# Patient Record
Sex: Female | Born: 1965
Health system: Southern US, Community
[De-identification: ages and names within clinical notes are randomized; demographics above are authoritative.]

## PROBLEM LIST (undated history)

## (undated) ENCOUNTER — Ambulatory Visit (HOSPITAL_COMMUNITY)

## (undated) DIAGNOSIS — E119 Type 2 diabetes mellitus without complications: Secondary | ICD-10-CM

## (undated) HISTORY — PX: INTRAUTERINE DEVICE (IUD) INSERTION: SHX5877

## (undated) HISTORY — DX: Type 2 diabetes mellitus without complications: E11.9

## (undated) HISTORY — PX: OTHER SURGICAL HISTORY: SHX169

## (undated) HISTORY — PX: BREAST SURGERY: SHX581

---

## 1997-06-28 ENCOUNTER — Ambulatory Visit (HOSPITAL_COMMUNITY): Admission: RE | Admit: 1997-06-28 | Discharge: 1997-06-28 | Payer: Self-pay | Admitting: Gynecology

## 1998-06-29 ENCOUNTER — Other Ambulatory Visit: Admission: RE | Admit: 1998-06-29 | Discharge: 1998-06-29 | Payer: Self-pay | Admitting: Gynecology

## 1998-09-24 ENCOUNTER — Other Ambulatory Visit: Admission: RE | Admit: 1998-09-24 | Discharge: 1998-09-24 | Payer: Self-pay | Admitting: Obstetrics and Gynecology

## 1999-03-26 ENCOUNTER — Other Ambulatory Visit: Admission: RE | Admit: 1999-03-26 | Discharge: 1999-03-26 | Payer: Self-pay | Admitting: Gynecology

## 1999-10-08 ENCOUNTER — Other Ambulatory Visit: Admission: RE | Admit: 1999-10-08 | Discharge: 1999-10-08 | Payer: Self-pay | Admitting: Gynecology

## 1999-10-09 ENCOUNTER — Encounter (INDEPENDENT_AMBULATORY_CARE_PROVIDER_SITE_OTHER): Payer: Self-pay

## 1999-10-09 ENCOUNTER — Other Ambulatory Visit: Admission: RE | Admit: 1999-10-09 | Discharge: 1999-10-09 | Payer: Self-pay | Admitting: Gynecology

## 2000-06-25 ENCOUNTER — Other Ambulatory Visit: Admission: RE | Admit: 2000-06-25 | Discharge: 2000-06-25 | Payer: Self-pay | Admitting: Gynecology

## 2001-12-09 ENCOUNTER — Other Ambulatory Visit: Admission: RE | Admit: 2001-12-09 | Discharge: 2001-12-09 | Payer: Self-pay | Admitting: Gynecology

## 2003-08-22 ENCOUNTER — Emergency Department (HOSPITAL_COMMUNITY): Admission: EM | Admit: 2003-08-22 | Discharge: 2003-08-22 | Payer: Self-pay | Admitting: Emergency Medicine

## 2006-12-24 ENCOUNTER — Ambulatory Visit (HOSPITAL_COMMUNITY): Admission: RE | Admit: 2006-12-24 | Discharge: 2006-12-24 | Payer: Self-pay | Admitting: Plastic Surgery

## 2007-01-26 ENCOUNTER — Ambulatory Visit (HOSPITAL_BASED_OUTPATIENT_CLINIC_OR_DEPARTMENT_OTHER): Admission: RE | Admit: 2007-01-26 | Discharge: 2007-01-27 | Payer: Self-pay | Admitting: Plastic Surgery

## 2007-01-26 ENCOUNTER — Encounter (INDEPENDENT_AMBULATORY_CARE_PROVIDER_SITE_OTHER): Payer: Self-pay | Admitting: Plastic Surgery

## 2008-05-03 ENCOUNTER — Emergency Department (HOSPITAL_COMMUNITY): Admission: EM | Admit: 2008-05-03 | Discharge: 2008-05-03 | Payer: Self-pay | Admitting: Emergency Medicine

## 2008-12-03 ENCOUNTER — Emergency Department (HOSPITAL_COMMUNITY): Admission: EM | Admit: 2008-12-03 | Discharge: 2008-12-03 | Payer: Self-pay | Admitting: Emergency Medicine

## 2010-06-02 ENCOUNTER — Encounter: Payer: Self-pay | Admitting: Plastic Surgery

## 2010-09-24 NOTE — Op Note (Signed)
Carly Santiago, PONG NO.:  [id number]   MEDICAL RECORD NO.:  [id number]          PATIENT TYPE:  AMB   LOCATION:  DSC                          FACILITY:  [facility]   PHYSICIAN:  Mary Contogiannis, M.D.DATE OF BIRTH:  1966/03/12   DATE OF PROCEDURE:  01/26/2007  DATE OF DISCHARGE:                               OPERATIVE REPORT   PREOPERATIVE DIAGNOSIS:  Bilateral macromastia.   POSTOPERATIVE DIAGNOSIS:  Bilateral macromastia.   PROCEDURE:  Bilateral reduction mammoplasty.   ATTENDING SURGEON:  Brantley Persons, M.D.   ANESTHESIA:  General endotracheal.   ANESTHESIOLOGIST:  Zenon Mayo, M.D.   ESTIMATED BLOOD LOSS:  300 mL.   FLUID REPLACEMENT:  3000 mL crystalloid.   URINE OUTPUT:  350 mL.   COMPLICATIONS:  None.   INDICATIONS FOR PROCEDURE:  The patient is a 45 year old African  American female who has bilateral macromastia that is clinically  symptomatic.  She presents to undergo bilateral reduction mammoplasties.   PROCEDURE:  The patient was marked in the preop holding area for the  future bilateral reduction mammoplasties in the pattern of Wise.  She  was then taken back to the OR and placed on the table in the supine  position.  After adequate general endotracheal anesthesia was obtained,  the patient's chest was scrubbed with Betadine and alcohol and draped in  a sterile fashion.  The base of the breasts were injected with 1%  lidocaine with epinephrine.  After adequate hemostasis anesthesia had  taken effect, the procedure was begun.   Both of the breast reductions were performed in the following similar  manner.  The nipple-areolar complexes were marked with the 45-mm nipple  marker.  This was then incised and the skin de-epithelialized around the  nipple-areolar complex down to the inframammary crease in inferior  pedicle pattern.  Next, the medial, superior and lateral skin flaps were  elevated down to the chest wall.  The excess  fat and glandular tissue  were removed from the inferior pedicle.  The nipple-areolar complex was  examined and found to be pink and viable.  The wound was irrigated with  saline irrigation.  Meticulous hemostasis was obtained with the Bovie  electrocautery.  The inferior pedicle was centralized with a 3-0 Prolene  suture.  A #10 JP flat fully fluted drain was placed into the wound.  The skin flaps were brought together at the inverted T junction with a 2-  0 Prolene suture.  The incisions were stapled for temporary closure.   The breasts were then compared and found to have good shape and  symmetry.  The staples were removed, and the incisions were closed from  the medial aspect of the JP drain to the medial aspect of the [facility]  incision using a few 3-0 Monocryl sutures in the dermal layer, and then  the dermal and cuticular layers were closed in a single layer using the  Quill 2-0 PDO barbed suture.  Lateral to the JP drain, the incision was  closed with a 3-0 Monocryl in the dermal layer followed by 3-0 Monocryl  running intracuticular stitch  on the skin.  The vertical limb of the  incision was closed in the dermal layer using 3-0 Monocryl suture.   The patient was then placed in the upright position.  The future  location of the nipple-areolar complexes was marked on both breast  mounds using the 45-mm nipple marker.  She was then placed back in the  recumbent position.   Both of the nipple-areolar complexes were then brought out onto the  breast mound in the following similar manner.  The skin was incised  around the future nipple-areolar complex full-thickness through the skin  and subcutaneous tissues.  The nipple-areolar complex was then examined  and found be pink and viable and brought out into this aperture and sewn  in place using 4-0 Monocryl in the dermal layer followed by 5-0 Monocryl  running intracuticular stitch on the skin.  This 5-0 Monocryl suture was  then brought  on down the vertical limb to close the vertical limb  cuticular layer.  The JP drains were sewn in place using 3-0 nylon  suture.  The incisions were dressed with Benzoin and Steri-Strips and  the nipples additionally with bacitracin ointment and Adaptic.  4 x 4s  were placed over the incisions, and the patient was placed into a light  postoperative support bra.  There are no complications.   The patient tolerated the procedure well.  The final needle and sponge  counts were reported to be correct at the end the case.  She was then  awakened from general anesthesia and taken to the recovery room in  stable condition.  The patient will remain overnight in the [facility] for  aggressive pain control along with ambulation and monitoring of the  nipples and breast flaps.  She will have the option of being discharged  home if she would like or stay overnight.  If she stays overnight, she  will then be discharged in the morning.           ______________________________  Brantley Persons, M.D.     MC/MEDQ  D:  01/26/2007  T:  01/26/2007  Job:  [id number]

## 2011-02-20 LAB — POCT HEMOGLOBIN-HEMACUE
Hemoglobin: 12.8
Operator id: 116011

## 2012-02-13 ENCOUNTER — Emergency Department (HOSPITAL_COMMUNITY)
Admission: EM | Admit: 2012-02-13 | Discharge: 2012-02-13 | Disposition: A | Discharge status: Home / Self Care | Payer: Self-pay | Attending: Emergency Medicine | Admitting: Emergency Medicine

## 2012-02-13 ENCOUNTER — Encounter (HOSPITAL_COMMUNITY): Payer: Self-pay | Admitting: Emergency Medicine

## 2012-02-13 DIAGNOSIS — M549 Dorsalgia, unspecified: Secondary | ICD-10-CM | POA: Insufficient documentation

## 2012-02-13 LAB — URINALYSIS, ROUTINE W REFLEX MICROSCOPIC
Leukocytes, UA: NEGATIVE
Nitrite: NEGATIVE
Protein, ur: NEGATIVE mg/dL
Urobilinogen, UA: 0.2 mg/dL (ref 0.0–1.0)

## 2012-02-13 LAB — PREGNANCY, URINE: Preg Test, Ur: NEGATIVE

## 2012-02-13 MED ORDER — METHOCARBAMOL 500 MG PO TABS: 500.0000 mg | ORAL_TABLET | Freq: Two times a day (BID) | ORAL | Status: DC | PRN

## 2012-02-13 MED ORDER — NAPROXEN 500 MG PO TABS: 500.0000 mg | ORAL_TABLET | Freq: Two times a day (BID) | ORAL | Status: DC

## 2012-02-13 MED ORDER — TRAMADOL HCL 50 MG PO TABS: 50.0000 mg | ORAL_TABLET | Freq: Four times a day (QID) | ORAL | Status: DC | PRN

## 2012-02-13 MED ORDER — NAPROXEN 250 MG PO TABS
500.0000 mg | ORAL_TABLET | Freq: Once | ORAL | Status: AC
  Administered 2012-02-13: 500 mg via ORAL
  Filled 2012-02-13 (×2): qty 1

## 2012-02-13 NOTE — ED Provider Notes (Signed)
History     CSN: [id number]  Arrival date & time 02/13/12  0120   First MD Initiated Contact with Patient 02/13/12 0136      Chief Complaint  Patient presents with  . Abdominal Pain  . Back Pain    (Consider location/radiation/quality/duration/timing/severity/associated sxs/prior treatment) HPI Comments: The pt is a pleasant 46 y/o female with no sig PMH and no frequent infections who presents with several days of bialteral flank and low back pain that radiates to the lower abd and is crampin in nature.  Worse with movement and changing positions such as getting to standing position or laying down but minimal pain when laying down.  Denies fevers, chills, nasuea, vomiting, diarrhea or dysuria.  No hematuria, no frequency.  No vag d/c or bleeding.  Sx are gradually worsening.  Denies red flags for pathological back pain.  Patient is a 46 y.o. female presenting with abdominal pain and back pain. The history is provided by the patient.  Abdominal Pain The primary symptoms of the illness include abdominal pain. The primary symptoms of the illness do not include fever, nausea, vomiting, dysuria or vaginal discharge.  Additional symptoms associated with the illness include back pain. Symptoms associated with the illness do not include chills.  Back Pain  Associated symptoms include abdominal pain. Pertinent negatives include no fever and no dysuria.    History reviewed. No pertinent past medical history.  History reviewed. No pertinent past surgical history.  History reviewed. No pertinent family history.  History  Substance Use Topics  . Smoking status: Never Smoker   . Smokeless tobacco: Not on file  . Alcohol Use: No    OB History    Grav Para Term Preterm Abortions TAB SAB Ect Mult Living                  Review of Systems  Constitutional: Negative for fever and chills.  Gastrointestinal: Positive for abdominal pain. Negative for nausea and vomiting.  Genitourinary:  Negative for dysuria, decreased urine volume, vaginal discharge, difficulty urinating and menstrual problem.  Musculoskeletal: Positive for back pain.  Skin: Negative for rash.    Allergies  Review of patient's allergies indicates no known allergies.  Home Medications   Current Outpatient Rx  Name Route Sig Dispense Refill  . CYANOCOBALAMIN 1000 MCG/ML IJ SOLN Intramuscular Inject 1,000 mcg into the muscle every 30 (thirty) days.    . LORCASERIN HCL 10 MG PO TABS Oral Take 10 mg by mouth 2 (two) times daily.    . TURMERIC CURCUMIN PO CAPS Oral Take 2 capsules by mouth 2 (two) times daily.    Marland Kitchen PHENTERMINE HCL 37.5 MG PO CAPS Oral Take 37.5 mg by mouth every morning.    Marland Kitchen PRESCRIPTION MEDICATION Oral Take 1 tablet by mouth daily. Didrex 50 mg    . METHOCARBAMOL 500 MG PO TABS Oral Take 1 tablet (500 mg total) by mouth 2 (two) times daily as needed. 20 tablet 0  . NAPROXEN 500 MG PO TABS Oral Take 1 tablet (500 mg total) by mouth 2 (two) times daily with a meal. 30 tablet 0  . TRAMADOL HCL 50 MG PO TABS Oral Take 1 tablet (50 mg total) by mouth every 6 (six) hours as needed for pain. 15 tablet 0    BP 112/71  Pulse 62  Temp 98.2 F (36.8 C) (Oral)  Resp 18  SpO2 98%  Physical Exam  Nursing note and vitals reviewed. Constitutional: She appears well-developed and well-nourished. No  distress.  HENT:  Head: Normocephalic and atraumatic.  Eyes: Conjunctivae normal are normal. No scleral icterus.  Cardiovascular: Normal rate, regular rhythm and normal heart sounds.   Pulmonary/Chest: Effort normal and breath sounds normal. No respiratory distress. She has no wheezes. She has no rales.  Abdominal: Soft. Bowel sounds are normal. She exhibits no distension. There is no tenderness.       Bilateral mild CVA ttp   Musculoskeletal: Normal range of motion. She exhibits no edema.       Bilateral lower back ttp, no midline ttp  Neurological:       nromal gait, normal use of legs and arms,  normal sensation of lower extremities.  Skin: Skin is warm and dry. No rash noted. No erythema.    ED Course  Procedures (including critical care time)  Labs Reviewed  URINALYSIS, ROUTINE W REFLEX MICROSCOPIC - Abnormal; Notable for the following:    APPearance CLOUDY (*)     All other components within normal limits  PREGNANCY, URINE   No results found.   1. Back pain       MDM  No abd ttp and normal VS - Urine appears cloudy and concentrated - consider UTI / pyelo in ddx, UA pending, naprosyn ordered at pt request, no fever.    UA negative for infx, VS normal, reproducible pain to palp of lumbar paraspinal muscles, naprosyn given, home with same meds and ultram, pt in agreement and requesting d/c at this time.        Vida Roller, MD 02/13/12 [phone]

## 2012-02-13 NOTE — ED Notes (Signed)
Patient reports lower back pain that radiates around her front abdomen for the last two days.  Patient reports recently drinking a lot of dark sodas.  Patient denies chest pain, nausea, vomiting, and diarrhea.

## 2013-04-03 ENCOUNTER — Encounter (HOSPITAL_COMMUNITY): Payer: Self-pay | Admitting: Emergency Medicine

## 2013-04-03 ENCOUNTER — Emergency Department (HOSPITAL_COMMUNITY)
Admission: EM | Admit: 2013-04-03 | Discharge: 2013-04-03 | Disposition: A | Discharge status: Home / Self Care | Payer: 59 | Source: Home / Self Care | Attending: Emergency Medicine | Admitting: Emergency Medicine

## 2013-04-03 ENCOUNTER — Emergency Department (INDEPENDENT_AMBULATORY_CARE_PROVIDER_SITE_OTHER): Payer: 59

## 2013-04-03 DIAGNOSIS — L259 Unspecified contact dermatitis, unspecified cause: Secondary | ICD-10-CM

## 2013-04-03 DIAGNOSIS — L309 Dermatitis, unspecified: Secondary | ICD-10-CM

## 2013-04-03 DIAGNOSIS — S92514A Nondisplaced fracture of proximal phalanx of right lesser toe(s), initial encounter for closed fracture: Secondary | ICD-10-CM

## 2013-04-03 DIAGNOSIS — S92919A Unspecified fracture of unspecified toe(s), initial encounter for closed fracture: Secondary | ICD-10-CM

## 2013-04-03 MED ORDER — KETOCONAZOLE 2 % EX CREA: 1.0000 "application " | TOPICAL_CREAM | Freq: Every day | CUTANEOUS | Status: DC

## 2013-04-03 NOTE — ED Notes (Signed)
Reports stubbing right 3rd toe on piece of furniture approx 1.5 wks ago; C/O toe being crooked, difficulty bending toe.  Has been elevating, icing, and using buddy tape without relief.  Also c/o left axillary pruritis and raised rash x 1 wk.  Has been applying abx oint.  Denies any changes in deoderant, soaps, lotions.

## 2013-04-03 NOTE — ED Provider Notes (Signed)
Chief Complaint:   Chief Complaint  Patient presents with  . Toe Injury  . 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 She's been elevating it and applying buddy tape, but this toe is still painful and seems to be somewhat crooked. It hurts to walk or to put pressure on it. It's slightly swollen.  She also has a two-week history of a mildly pruritic rash in her left axilla. She cannot think of anything that she's come in contact with. She denies any other skin rash.  Review of Systems:  Other than noted above, the patient denies any of the following symptoms: Systemic:  No fevers, chills, or sweats.  No fatigue or tiredness. Musculoskeletal:  No joint pain, arthritis, bursitis, swelling, or back pain.  Neurological:  No muscular weakness, paresthesias.  PMFSH:  Past medical history, family history, social history, meds, and allergies were reviewed.    Physical Exam:   Vital signs:  BP 128/87  Pulse 63  Temp(Src) 98.2 F (36.8 C) (Oral)  Resp 16  SpO2 98% Gen:  Alert and oriented times 3.  In no distress. Musculoskeletal:  Exam of the foot reveals the middle to the right foot was swollen and tender to touch over the proximal phalanx. There was slight angulation laterally, and there is pain with movement.  Otherwise, all joints had a full a ROM with no swelling, bruising or deformity.  No edema, pulses full. Extremities were warm and pink.  Capillary refill was brisk.  Skin:  Clear, warm and dry.  Exam of the left axilla reveals a hyperpigmented, velvety rash. The right axilla is normal. Neuro:  Alert and oriented times 3.  Muscle strength was normal.  Sensation was intact to light touch.   Radiology:  Dg Foot Complete Right  04/03/2013   CLINICAL DATA:  Right foot injury to the 3rd toe.  EXAM: RIGHT FOOT COMPLETE - 3+  VIEW  COMPARISON:  None.  FINDINGS: Oblique and minimally displaced fracture is identified of the 3rd proximal phalanx. No other injuries are seen. Soft tissues are unremarkable. No significant arthropathy or bony lesions are identified.  IMPRESSION: Minimally displaced fracture of the 3rd proximal phalanx.   Electronically Signed   By: Irish Lack M.D.   On: 04/03/2013 09:42   I reviewed the images independently and personally and concur with the radiologist's findings.  Course in Urgent Care Center:   The toe was buddy taped, and the patient was placed in a postoperative boot. She will need to wear this through the end of the year.  Assessment:  The primary encounter diagnosis was Closed nondisplaced fracture of proximal phalanx of lesser toe of right foot, initial encounter. A diagnosis of Dermatitis was also pertinent to this visit.  The rash in the axilla may be bacterial or fungal infection. Differential diagnosis includes acanthosis nigricans. I'm going to try treatment with Nizoral cream. If no improvement after 2 weeks I suggested she see a dermatologist.  Plan:   1.  Meds:  The following meds were prescribed:   Discharge Medication List as of 04/03/2013  9:54 AM    START taking these medications   Details  ketoconazole (NIZORAL) 2 % cream Apply 1 application topically daily., Starting 04/03/2013, Until Discontinued, Normal        2.  Patient  Education/Counseling:  The patient was given appropriate handouts, self care instructions, and instructed in symptomatic relief including rest and activity, elevation, application of ice and compression.   3.  Follow up:  The patient was told to follow up if no better in 3 to 4 days, if becoming worse in any way, and given some red flag symptoms such as worsening pain which would prompt immediate return.  Follow up here for the foot problem and with a dermatologist, Dr. Para Skeans, for the skin problem.       Reuben Likes,  MD 04/03/13 1040

## 2013-05-24 ENCOUNTER — Other Ambulatory Visit: Payer: Self-pay

## 2013-05-24 DIAGNOSIS — Z1231 Encounter for screening mammogram for malignant neoplasm of breast: Secondary | ICD-10-CM

## 2013-06-13 ENCOUNTER — Ambulatory Visit: Payer: 59

## 2013-09-02 ENCOUNTER — Ambulatory Visit: Payer: 59

## 2013-09-02 ENCOUNTER — Emergency Department (HOSPITAL_COMMUNITY)
Admission: EM | Admit: 2013-09-02 | Discharge: 2013-09-02 | Disposition: A | Discharge status: Home / Self Care | Payer: 59 | Source: Home / Self Care

## 2013-09-02 ENCOUNTER — Encounter (HOSPITAL_COMMUNITY): Payer: Self-pay | Admitting: Emergency Medicine

## 2013-09-02 DIAGNOSIS — M7660 Achilles tendinitis, unspecified leg: Secondary | ICD-10-CM

## 2013-09-02 DIAGNOSIS — M7662 Achilles tendinitis, left leg: Secondary | ICD-10-CM

## 2013-09-02 NOTE — Discharge Instructions (Signed)
Achilles Tendinitis °Achilles tendinitis is inflammation of the tough, cord-like band that attaches the lower muscles of your leg to your heel (Achilles tendon). It is usually caused by overusing the tendon and joint involved.  °CAUSES °Achilles tendinitis can happen because of: °· A sudden increase in exercise or activity (such as running). °· Doing the same exercises or activities (such as jumping) over and over. °· Not warming up calf muscles before exercising. °· Exercising in shoes that are worn out or not made for exercise. °· Having arthritis or a bone growth on the back of the heel bone. This can rub against the tendon and hurt the tendon. °SIGNS AND SYMPTOMS °The most common symptoms are: °· Pain in the back of the leg, just above the heel. The pain usually gets worse with exercise and better with rest. °· Stiffness or soreness in the back of the leg, especially in the morning. °· Swelling of the skin over the Achilles tendon. °· Trouble standing on tiptoe. °Sometimes, an Achilles tendon tears (ruptures). Symptoms of an Achilles tendon rupture can include: °· Sudden, severe pain in the back of the leg. °· Trouble putting weight on the foot or walking normally. °DIAGNOSIS °Achilles tendinitis will be diagnosed based on symptoms and a physical examination. An X-ray may be done to check if another condition is causing your symptoms. An MRI may be ordered if your health care provider suspects you may have completely torn your tendon, which is called an Achilles tendon rupture.  °TREATMENT  °Achilles tendinitis usually gets better over time. It can take weeks to months to heal completely. Treatment focuses on treating the symptoms and helping the injury heal. °HOME CARE INSTRUCTIONS  °· Rest your Achilles tendon and avoid activities that cause pain. °· Apply ice to the injured area: °· Put ice in a plastic bag. °· Place a towel between your skin and the bag. °· Leave the ice on for 20 minutes, 2 3 times a  day °· Try to avoid using the tendon (other than gentle range of motion) while the tendon is painful. Do not resume use until instructed by your health care provider. Then begin use gradually. Do not increase use to the point of pain. If pain does develop, decrease use and continue the above measures. Gradually increase activities that do not cause discomfort until you achieve normal use. °· Do exercises to make your calf muscles stronger and more flexible. Your health care provider or physical therapist can recommend exercises for you to do. °· Wrap your ankle with an elastic bandage or other wrap. This can help keep your tendon from moving too much. Your health care provider will show you how to wrap your ankle correctly. °· Only take over-the-counter or prescription medicines for pain, discomfort, or fever as directed by your health care provider. °SEEK MEDICAL CARE IF:  °· Your pain and swelling increase or pain is uncontrolled with medicines. °· You develop new, unexplained symptoms or your symptoms get worse. °· You are unable to move your toes or foot. °· You develop warmth and swelling in your foot. °· You have an unexplained temperature. °MAKE SURE YOU:  °· Understand these instructions. °· Will watch your condition. °· Will get help right away if you are not doing well or get worse. °Document Released: 02/05/2005 Document Revised: 02/16/2013 Document Reviewed: 12/08/2012 °ExitCare® Patient Information ©2014 [employer]. ° °

## 2013-09-02 NOTE — ED Provider Notes (Signed)
Medical screening examination/treatment/procedure(s) were performed by resident physician or non-physician practitioner and as supervising physician I was immediately available for consultation/collaboration.   Pauline Good MD.   Billy Fischer, MD 09/02/13 [phone]

## 2013-09-02 NOTE — ED Provider Notes (Signed)
CSN: [id number]     Arrival date & time 09/02/13  1101 History   First MD Initiated Contact with Patient 09/02/13 1125     Chief Complaint  Patient presents with  . 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 Over past few weeks having discomfort in the lower 1/3 of the achilles tendon. Worse with ambulation and when wearing low sided shoes. No hx of trauma. Not a runner.   History reviewed. No pertinent past medical history. History reviewed. No pertinent past surgical history. History reviewed. No pertinent family history. History  Substance Use Topics  . Smoking status: Never Smoker   . Smokeless tobacco: Not on file  . Alcohol Use: No   OB History   Grav Para Term Preterm Abortions TAB SAB Ect Mult Living                 Review of Systems  Constitutional: Positive for activity change. Negative for fever, chills and fatigue.  HENT: Negative.   Respiratory: Negative.   Cardiovascular: Negative.   Musculoskeletal:       As per HPI  Skin: Negative for color change, pallor and rash.  Neurological: Negative.     Allergies  Review of patient's allergies indicates no known allergies.  Home Medications   Prior to Admission medications   Medication Sig Start Date End Date Taking? Authorizing Provider  cyanocobalamin (,VITAMIN B-12,) 1000 MCG/ML injection Inject 1,000 mcg into the muscle every 30 (thirty) days.    Historical Provider, MD  ketoconazole (NIZORAL) 2 % cream Apply 1 application topically daily. 04/03/13   Harden Mo, MD  Lorcaserin HCl (BELVIQ) 10 MG TABS Take 10 mg by mouth 2 (two) times daily.    Historical Provider, MD  methocarbamol (ROBAXIN) 500 MG tablet Take 1 tablet (500 mg total) by mouth 2 (two) times daily as needed. 02/13/12   Johnna Acosta, MD  Misc Natural  Products (TURMERIC CURCUMIN) CAPS Take 2 capsules by mouth 2 (two) times daily.    Historical Provider, MD  naproxen (NAPROSYN) 500 MG tablet Take 1 tablet (500 mg total) by mouth 2 (two) times daily with a meal. 02/13/12   Johnna Acosta, MD  phentermine 37.5 MG capsule Take 37.5 mg by mouth every morning.    Historical Provider, MD  PRESCRIPTION MEDICATION Take 1 tablet by mouth daily. Didrex 50 mg    Historical Provider, MD  traMADol (ULTRAM) 50 MG tablet Take 1 tablet (50 mg total) by mouth every 6 (six) hours as needed for pain. 02/13/12   Johnna Acosta, MD   BP 107/57  Pulse 60  Temp(Src) 98.3 F (36.8 C) (Oral)  Resp 18  SpO2 98% Physical Exam  Nursing note and vitals reviewed. Constitutional: She is oriented to person, place, and time. She appears well-developed and well-nourished. No distress.  HENT:  Head: Normocephalic and atraumatic.  Eyes: EOM are normal.  Pulmonary/Chest: Effort normal. No respiratory distress.  Musculoskeletal:  Nl dorsi-plantar flexion. Tenderness to the left achilles tendon, lower third. Pain reproduced with plantar flexion against resistance. No palpable deformities, edema or discoloration.   Neurological: She is alert and oriented to person, place, and time. No cranial nerve deficit.  Skin: Skin is warm and dry.  Psychiatric: She has a normal mood and affect.    ED Course  Procedures (including critical care time) Labs Review  Labs Reviewed - No data to display  Imaging Review No results found.   MDM   1. Tendonitis, Achilles, left     No evidence for rupture ASO Ice Limit excessive ambulation with push off without support for 1-2 weeks.     Janne Napoleon, NP 09/02/13 1147

## 2013-09-02 NOTE — ED Notes (Signed)
Reports having pain in left achilles.  Pain is worse when wearing flat shoes.  States on feet a lot at work and works 16 hour shift sometimes.  No relief with heat, elevation, or otc pain meds.  Denies injury.

## 2014-05-02 ENCOUNTER — Emergency Department (HOSPITAL_COMMUNITY): Payer: 59

## 2014-05-02 ENCOUNTER — Encounter (HOSPITAL_COMMUNITY): Payer: Self-pay | Admitting: Emergency Medicine

## 2014-05-02 ENCOUNTER — Emergency Department (HOSPITAL_COMMUNITY)
Admission: EM | Admit: 2014-05-02 | Discharge: 2014-05-02 | Disposition: A | Discharge status: Home / Self Care | Payer: 59 | Attending: Emergency Medicine | Admitting: Emergency Medicine

## 2014-05-02 DIAGNOSIS — M79651 Pain in right thigh: Secondary | ICD-10-CM | POA: Insufficient documentation

## 2014-05-02 DIAGNOSIS — Z79899 Other long term (current) drug therapy: Secondary | ICD-10-CM | POA: Insufficient documentation

## 2014-05-02 DIAGNOSIS — M25551 Pain in right hip: Secondary | ICD-10-CM | POA: Diagnosis present

## 2014-05-02 MED ORDER — HYDROCODONE-ACETAMINOPHEN 5-325 MG PO TABS: 1.0000 | ORAL_TABLET | Freq: Four times a day (QID) | ORAL | Status: DC | PRN

## 2014-05-02 MED ORDER — KETOROLAC TROMETHAMINE 60 MG/2ML IM SOLN
60.0000 mg | Freq: Once | INTRAMUSCULAR | Status: AC
  Administered 2014-05-02: 60 mg via INTRAMUSCULAR
  Filled 2014-05-02: qty 2

## 2014-05-02 MED ORDER — DIAZEPAM 5 MG PO TABS
5.0000 mg | ORAL_TABLET | Freq: Once | ORAL | Status: AC
  Administered 2014-05-02: 5 mg via ORAL

## 2014-05-02 MED ORDER — IBUPROFEN 600 MG PO TABS: 600.0000 mg | ORAL_TABLET | Freq: Three times a day (TID) | ORAL | Status: DC | PRN

## 2014-05-02 MED ORDER — OXYCODONE-ACETAMINOPHEN 5-325 MG PO TABS
1.0000 | ORAL_TABLET | Freq: Once | ORAL | Status: AC
  Administered 2014-05-02: 1 via ORAL
  Filled 2014-05-02: qty 1

## 2014-05-02 NOTE — ED Notes (Signed)
PT ambulated with baseline gait; VSS; A&Ox3; no signs of distress; respirations even and unlabored; skin warm and dry; no questions upon discharge.  

## 2014-05-02 NOTE — ED Notes (Signed)
Patient transported to X-ray 

## 2014-05-02 NOTE — ED Notes (Signed)
Pt. reports right hip pain onset yesterday , denies injury or fall, no strenuous activity or urinary discomfort. Pain worse with movement , palpation and certain positions .

## 2014-05-02 NOTE — Discharge Instructions (Signed)

## 2014-05-03 NOTE — ED Provider Notes (Signed)
CSN: [id number]     Arrival date & time 05/02/14  0434 History   First MD Initiated Contact with Patient 05/02/14 0505     Chief Complaint  Patient presents with  . Hip Pain      HPI Patient reports right hip pain since yesterday.  She does report starting a new exercise routine to 3 days ago.  She states the pain is began the past 24 hours.  She feels it in her right lateral thigh and in her right hip.  She has mild pain with range of motion of her right hip.  No fevers or chills.  No skin changes or redness.  No recent injury or trauma.  She denies numbness or tingling in her right foot.  No weakness of her right leg.  Patient has never had pain like this before.  She tried over-the-counter pain medication without improvement in her symptoms.   History reviewed. No pertinent past medical history. History reviewed. No pertinent past surgical history. No family history on file. History  Substance Use Topics  . Smoking status: Never Smoker   . Smokeless tobacco: Not on file  . Alcohol Use: No   OB History    No data available     Review of Systems  All other systems reviewed and are negative.     Allergies  Review of patient's allergies indicates no known allergies.  Home Medications   Prior to Admission medications   Medication Sig Start Date End Date Taking? Authorizing Provider  cyanocobalamin (,VITAMIN B-12,) 1000 MCG/ML injection Inject 1,000 mcg into the muscle every 30 (thirty) days.   Yes Historical Provider, MD  metFORMIN (GLUCOPHAGE) 500 MG tablet Take 250 mg by mouth every evening.   Yes Historical Provider, MD  phentermine 37.5 MG capsule Take 37.5 mg by mouth every morning.   Yes Historical Provider, MD  PRESCRIPTION MEDICATION Take 1 tablet by mouth daily. Didrex 50 mg   Yes Historical Provider, MD  HYDROcodone-acetaminophen (NORCO/VICODIN) 5-325 MG per tablet Take 1 tablet by mouth every 6 (six) hours as needed for moderate pain. 05/02/14   Hoy Morn, MD   ibuprofen (ADVIL,MOTRIN) 600 MG tablet Take 1 tablet (600 mg total) by mouth every 8 (eight) hours as needed. 05/02/14   Hoy Morn, MD  ketoconazole (NIZORAL) 2 % cream Apply 1 application topically daily. Patient not taking: Reported on 05/02/2014 04/03/13   Harden Mo, MD  Lorcaserin HCl (BELVIQ) 10 MG TABS Take 10 mg by mouth 2 (two) times daily.    Historical Provider, MD  methocarbamol (ROBAXIN) 500 MG tablet Take 1 tablet (500 mg total) by mouth 2 (two) times daily as needed. Patient not taking: Reported on 05/02/2014 02/13/12   Johnna Acosta, MD  Misc Natural Products (TURMERIC CURCUMIN) CAPS Take 2 capsules by mouth 2 (two) times daily.    Historical Provider, MD  naproxen (NAPROSYN) 500 MG tablet Take 1 tablet (500 mg total) by mouth 2 (two) times daily with a meal. Patient not taking: Reported on 05/02/2014 02/13/12   Johnna Acosta, MD  traMADol (ULTRAM) 50 MG tablet Take 1 tablet (50 mg total) by mouth every 6 (six) hours as needed for pain. Patient not taking: Reported on 05/02/2014 02/13/12   Johnna Acosta, MD   BP 100/63 mmHg  Pulse 59  Temp(Src) 98.2 F (36.8 C) (Oral)  Resp 18  Ht 5\' 9"  (1.753 m)  Wt 250 lb (113.399 kg)  BMI 36.90 kg/m2  SpO2  97% Physical Exam  Constitutional: She is oriented to person, place, and time. She appears well-developed and well-nourished. No distress.  HENT:  Head: Normocephalic and atraumatic.  Eyes: EOM are normal.  Neck: Normal range of motion.  Pulmonary/Chest: Effort normal.  Abdominal: Soft.  Musculoskeletal:  Normal PT and DP pulse in right foot.  Normal strength in right lower extremity.  Able to arrange right knee and right hip.  Mild pain with range of motion of right hip.  No significant focal tenderness of the lateral aspect of the thigh.  No overlying skin changes.  Neurological: She is alert and oriented to person, place, and time.  Skin: Skin is warm and dry.  Psychiatric: She has a normal mood and affect. Judgment  normal.  Nursing note and vitals reviewed.   ED Course  Procedures (including critical care time) Labs Review Labs Reviewed - No data to display  Imaging Review Dg Hip Complete Right  05/02/2014   CLINICAL DATA:  Acute onset of right lateral hip pain. Initial encounter.  EXAM: RIGHT HIP - COMPLETE 2+ VIEW  COMPARISON:  None.  FINDINGS: There is no evidence of fracture or dislocation. Both femoral heads are seated normally within their respective acetabula. The proximal right femur appears intact. No significant degenerative change is appreciated. The sacroiliac joints are unremarkable in appearance.  The visualized bowel gas pattern is grossly unremarkable in appearance. Scattered phleboliths are noted within the pelvis. An intrauterine device is noted overlying the mid pelvis.  IMPRESSION: No evidence of fracture or dislocation.   Electronically Signed   By: Garald Balding M.D.   On: 05/02/2014 06:45  I personally reviewed the imaging tests through PACS system I reviewed available ER/hospitalization records through the EMR    EKG Interpretation None      MDM   Final diagnoses:  Right hip pain    Patient feels much better at time of discharge.  Suspect overuse injury from her recent new exercise regimen.  No signs of infection.  Normal pulses in right foot.  Full range of motion of right knee and right hip.  X-ray normal.    Hoy Morn, MD 05/03/14 [phone]

## 2014-06-02 ENCOUNTER — Ambulatory Visit: Payer: 59

## 2014-06-21 ENCOUNTER — Ambulatory Visit: Payer: 59

## 2014-06-29 ENCOUNTER — Ambulatory Visit: Payer: Self-pay | Admitting: Podiatry

## 2015-05-13 HISTORY — PX: IUD REMOVAL: SHX5392

## 2016-03-04 DIAGNOSIS — H524 Presbyopia: Secondary | ICD-10-CM | POA: Diagnosis not present

## 2016-03-10 ENCOUNTER — Encounter (HOSPITAL_COMMUNITY): Payer: Self-pay | Admitting: Emergency Medicine

## 2016-03-10 ENCOUNTER — Emergency Department (HOSPITAL_COMMUNITY)
Admission: EM | Admit: 2016-03-10 | Discharge: 2016-03-10 | Disposition: A | Discharge status: Home / Self Care | Payer: 59 | Attending: Physician Assistant | Admitting: Physician Assistant

## 2016-03-10 DIAGNOSIS — T7840XA Allergy, unspecified, initial encounter: Secondary | ICD-10-CM | POA: Insufficient documentation

## 2016-03-10 DIAGNOSIS — Z79899 Other long term (current) drug therapy: Secondary | ICD-10-CM | POA: Diagnosis not present

## 2016-03-10 DIAGNOSIS — Z7982 Long term (current) use of aspirin: Secondary | ICD-10-CM | POA: Diagnosis not present

## 2016-03-10 MED ORDER — DIPHENHYDRAMINE HCL 50 MG/ML IJ SOLN
50.0000 mg | Freq: Once | INTRAMUSCULAR | Status: AC
  Administered 2016-03-10: 50 mg via INTRAVENOUS
  Filled 2016-03-10: qty 1

## 2016-03-10 MED ORDER — METHYLPREDNISOLONE SODIUM SUCC 125 MG IJ SOLR
125.0000 mg | Freq: Once | INTRAMUSCULAR | Status: AC
  Administered 2016-03-10: 125 mg via INTRAVENOUS
  Filled 2016-03-10: qty 2

## 2016-03-10 MED ORDER — EPINEPHRINE 0.3 MG/0.3ML IJ SOAJ: 0.3000 mg | Freq: Once | INTRAMUSCULAR | 2 refills | Status: AC

## 2016-03-10 MED ORDER — DIPHENHYDRAMINE HCL 50 MG/ML IJ SOLN: 25.0000 mg | Freq: Once | INTRAMUSCULAR | Status: DC

## 2016-03-10 MED ORDER — FAMOTIDINE IN NACL 20-0.9 MG/50ML-% IV SOLN
20.0000 mg | Freq: Once | INTRAVENOUS | Status: AC
  Administered 2016-03-10: 20 mg via INTRAVENOUS
  Filled 2016-03-10: qty 50

## 2016-03-10 MED ORDER — DIPHENHYDRAMINE HCL 25 MG PO TABS: 50.0000 mg | ORAL_TABLET | Freq: Four times a day (QID) | ORAL | 0 refills | Status: DC

## 2016-03-10 NOTE — ED Notes (Signed)
Pt reports using a night time cream called "Ponds". She states she has not used it in years and 5 minutes after using is when the reaction started.

## 2016-03-10 NOTE — ED Notes (Signed)
Patient reports that she ate shrimp this afternoon and ate more than usual. Patient states she has not had a reaction to shrimp before.

## 2016-03-10 NOTE — ED Triage Notes (Signed)
Pt c/o wheals, erythema, and hot sensation to face, hands. Tachycardia. Wheals to face and left conjunctiva. Throat tightness, lip swelling. In control of oral secretions. No SOB, no voice changes.

## 2016-03-10 NOTE — ED Provider Notes (Signed)
[facility] DEPT Provider Note   CSN: [id number] Arrival date & time: 03/10/16  1850     History   Chief Complaint Chief Complaint  Patient presents with  . Allergic Reaction    HPI Carly Santiago is a 50 y.o. female.  HPI   Patient is a 50 year old female presenting with allergic reaction symptoms. Patient reports that she took a shower today when she got out she had swelling in her hand. Shehad several uticaria on her face. Patient has not had any new shower materials, towels, cream. Patient said initially she thought she was having shortness of breath but that may been anxiety. She does not have any on arrival to her room in the emergency department.  She also reports eating increased shrimp yesterday.  History reviewed. No pertinent past medical history.  There are no active problems to display for this patient.   History reviewed. No pertinent surgical history.  OB History    No data available       Home Medications    Prior to Admission medications   Medication Sig Start Date End Date Taking? Authorizing Provider  cyanocobalamin (,VITAMIN B-12,) 1000 MCG/ML injection Inject 1,000 mcg into the muscle every 30 (thirty) days.    Historical Provider, MD  HYDROcodone-acetaminophen (NORCO/VICODIN) 5-325 MG per tablet Take 1 tablet by mouth every 6 (six) hours as needed for moderate pain. 05/02/14   Jola Schmidt, MD  ibuprofen (ADVIL,MOTRIN) 600 MG tablet Take 1 tablet (600 mg total) by mouth every 8 (eight) hours as needed. 05/02/14   Jola Schmidt, MD  ketoconazole (NIZORAL) 2 % cream Apply 1 application topically daily. Patient not taking: Reported on 05/02/2014 04/03/13   Harden Mo, MD  Lorcaserin HCl (BELVIQ) 10 MG TABS Take 10 mg by mouth 2 (two) times daily.    Historical Provider, MD  metFORMIN (GLUCOPHAGE) 500 MG tablet Take 250 mg by mouth every evening.    Historical Provider, MD  methocarbamol (ROBAXIN) 500 MG tablet Take 1 tablet (500 mg total) by  mouth 2 (two) times daily as needed. Patient not taking: Reported on 05/02/2014 02/13/12   Noemi Chapel, MD  Misc Natural Products (TURMERIC CURCUMIN) CAPS Take 2 capsules by mouth 2 (two) times daily.    Historical Provider, MD  naproxen (NAPROSYN) 500 MG tablet Take 1 tablet (500 mg total) by mouth 2 (two) times daily with a meal. Patient not taking: Reported on 05/02/2014 02/13/12   Noemi Chapel, MD  phentermine 37.5 MG capsule Take 37.5 mg by mouth every morning.    Historical Provider, MD  PRESCRIPTION MEDICATION Take 1 tablet by mouth daily. Didrex 50 mg    Historical Provider, MD  traMADol (ULTRAM) 50 MG tablet Take 1 tablet (50 mg total) by mouth every 6 (six) hours as needed for pain. Patient not taking: Reported on 05/02/2014 02/13/12   Noemi Chapel, MD    Family History History reviewed. No pertinent family history.  Social History Social History  Substance Use Topics  . Smoking status: Never Smoker  . Smokeless tobacco: Not on file  . Alcohol use No     Allergies   Review of patient's allergies indicates no known allergies.   Review of Systems Review of Systems  Constitutional: Negative for fatigue and fever.  HENT: Negative for facial swelling, sore throat and trouble swallowing.   Eyes: Negative for itching.  Respiratory: Negative for cough, chest tightness and shortness of breath.   Cardiovascular: Negative for chest pain.  Gastrointestinal: Negative for  abdominal pain.  Genitourinary: Negative for dysuria.  Skin: Positive for color change and rash.  Neurological: Negative for dizziness.  All other systems reviewed and are negative.    Physical Exam Updated Vital Signs BP 151/91   Pulse 69   Temp 98.2 F (36.8 C) (Oral)   Resp 16   SpO2 98%   Physical Exam  Constitutional: She is oriented to person, place, and time. She appears well-developed and well-nourished.  HENT:  Head: Normocephalic and atraumatic.  Eyes: Right eye exhibits no discharge.    Cardiovascular: Normal rate, regular rhythm and normal heart sounds.   No murmur heard. Pulmonary/Chest: Effort normal and breath sounds normal. She has no wheezes. She has no rales.  Abdominal: Soft. She exhibits no distension. There is no tenderness.  Neurological: She is oriented to person, place, and time.  Skin: Skin is warm and dry. She is not diaphoretic.  Scattered U to carry on face. Palm aspect of  hands are red and swollen.  Psychiatric: She has a normal mood and affect.  Nursing note and vitals reviewed.    ED Treatments / Results  Labs (all labs ordered are listed, but only abnormal results are displayed) Labs Reviewed - No data to display  EKG  EKG Interpretation None       Radiology No results found.  Procedures Procedures (including critical care time)  Medications Ordered in ED Medications  famotidine (PEPCID) IVPB 20 mg premix (20 mg Intravenous New Bag/Given 03/10/16 1917)  methylPREDNISolone sodium succinate (SOLU-MEDROL) 125 mg/2 mL injection 125 mg (125 mg Intravenous Given 03/10/16 1913)  diphenhydrAMINE (BENADRYL) injection 50 mg (50 mg Intravenous Given 03/10/16 1911)     Initial Impression / Assessment and Plan / ED Course  I have reviewed the triage vital signs and the nursing notes.  Pertinent labs & imaging results that were available during my care of the patient were reviewed by me and considered in my medical decision making (see chart for details).  Clinical Course    PT is a very pleasant 50 year old female here with allergic reaction-like symptoms. Patient had no known allergen exposure. She does have scattered uticaria on face. However nothing involving her lungs or her mouth. No reaction seen elsewhere on skin. Patient does have swelling and erythema to the palmar aspect of palms.  9:43 PM Much improved, she now ednorses that her hand cream hasn't been used in 5 years and was on her hands and face- the likely culprit today.    .    Final Clinical Impressions(s) / ED Diagnoses   Final diagnoses:  None    New Prescriptions New Prescriptions   No medications on file     [place] Dann Julio Alm, MD 03/10/16 2345

## 2016-03-10 NOTE — Discharge Instructions (Signed)
You were seen today with allergic reaction. We think this is likely to hand cream you used. Please use Benadryl around the clock to help with symptoms and we gave you a prescription for an EpiPen just in case it was the shrimp that you ate earlier today.

## 2016-03-27 MED FILL — EPINEPHRINE 0.3 MG AUTO-INJ: 0.3 | 30 days supply | Qty: 2 | Fill #0

## 2016-10-30 ENCOUNTER — Encounter: Payer: Self-pay | Admitting: Podiatry

## 2016-10-30 ENCOUNTER — Ambulatory Visit (INDEPENDENT_AMBULATORY_CARE_PROVIDER_SITE_OTHER): Payer: 59 | Admitting: Podiatry

## 2016-10-30 ENCOUNTER — Ambulatory Visit (INDEPENDENT_AMBULATORY_CARE_PROVIDER_SITE_OTHER): Payer: 59

## 2016-10-30 VITALS — BP 136/84 | HR 79 | Resp 16

## 2016-10-30 DIAGNOSIS — M722 Plantar fascial fibromatosis: Secondary | ICD-10-CM

## 2016-10-30 DIAGNOSIS — M7662 Achilles tendinitis, left leg: Secondary | ICD-10-CM

## 2016-10-30 DIAGNOSIS — M7661 Achilles tendinitis, right leg: Secondary | ICD-10-CM

## 2016-10-30 MED ORDER — METHYLPREDNISOLONE 4 MG PO TBPK: ORAL_TABLET | ORAL | 0 refills | Status: DC

## 2016-10-30 MED ORDER — MELOXICAM 15 MG PO TABS: 15.0000 mg | ORAL_TABLET | Freq: Every day | ORAL | 3 refills | Status: DC

## 2016-10-30 NOTE — Patient Instructions (Signed)

## 2016-10-30 NOTE — Progress Notes (Signed)
   Subjective:    Patient ID: Carly Santiago, female    DOB: [birth year], 51 y.o.   MRN: [id number]  HPI: She presents today with a chief complaint of pain to the posterior heel bilateral right greater than left. She states has been aching for about 7 months mornings are particularly bad with tightness she states that the higher the heel where the better my foot feels. She's try stretching and rolling a ball and arch she states is really no better. She's tried ice no avail.    Review of Systems  All other systems reviewed and are negative.      Objective:   Physical Exam: Vital signs are stable she is alert and oriented 3. Pulses are palpable. Neurologic sensorium is intact. Deep tendon reflexes are intact muscle strength is normal bilateral. Orthopedic evaluation demonstrates all joints distal to the ankle in full range of motion of the crepitation. She has pain on palpation of the Achilles tendon at the insertion sign posterior aspect of the bilateral ankle. Regress taken today do demonstrate soft tissue increase in density of the Achilles tendon that is insertion site. With posterior proximal oriented spurring. Some soft tissue increase in density at the plantar fascia cranial insertion site and we tender on palpation.        Assessment & Plan:  Plantar fasciitis and Achilles tendinitis bilateral.  Plan: I injected the right tilt today with dexamethasone and local anesthetic placed her in a Cam Walker and start her on a Medrol Dosepak to be followed by meloxicam and I will follow-up with her in 1 month.

## 2016-11-26 DIAGNOSIS — R81 Glycosuria: Secondary | ICD-10-CM | POA: Diagnosis not present

## 2016-11-26 DIAGNOSIS — N762 Acute vulvitis: Secondary | ICD-10-CM | POA: Diagnosis not present

## 2016-12-04 ENCOUNTER — Ambulatory Visit: Payer: 59 | Admitting: Podiatry

## 2016-12-09 ENCOUNTER — Ambulatory Visit: Payer: 59

## 2016-12-09 DIAGNOSIS — E1165 Type 2 diabetes mellitus with hyperglycemia: Secondary | ICD-10-CM | POA: Diagnosis not present

## 2016-12-11 DIAGNOSIS — E1165 Type 2 diabetes mellitus with hyperglycemia: Secondary | ICD-10-CM | POA: Diagnosis not present

## 2016-12-16 ENCOUNTER — Ambulatory Visit: Payer: 59

## 2016-12-23 ENCOUNTER — Ambulatory Visit: Payer: 59

## 2017-01-13 ENCOUNTER — Encounter: Payer: 59 | Attending: Endocrinology | Admitting: *Deleted

## 2017-01-13 DIAGNOSIS — Z713 Dietary counseling and surveillance: Secondary | ICD-10-CM | POA: Diagnosis not present

## 2017-01-13 DIAGNOSIS — E119 Type 2 diabetes mellitus without complications: Secondary | ICD-10-CM | POA: Diagnosis not present

## 2017-01-14 NOTE — Progress Notes (Signed)
Patient was seen on 01/13/2017 for the first of a series of three diabetes self-management courses at [facility]  Patient Education Plan per assessed needs and concerns is to attend four course education program for Diabetes Self Management Education.  The following learning objectives were met by the patient during this class:  Describe diabetes  State some common risk factors for diabetes  Defines the role of glucose and insulin  Identifies type of diabetes and pathophysiology  Describe the relationship between diabetes and cardiovascular risk  State the members of the Healthcare Team  States the rationale for glucose monitoring  State when to test glucose  State their individual Target Range  State the importance of logging glucose readings  Describe how to interpret glucose readings  Identifies A1C target  Explain the correlation between A1c and eAG values  State symptoms and treatment of high blood glucose  State symptoms and treatment of low blood glucose  Explain proper technique for glucose testing  Identifies proper sharps disposal  Handouts given during class include:  Living Well with Diabetes book  Carb Counting and Meal Planning book  Meal Plan Card  Carbohydrate guide  Meal planning worksheet  Low Sodium Flavoring Tips  The diabetes portion plate  T0P to eAG Conversion Chart  Diabetes Medications  Diabetes Recommended Care Schedule  Support Group  Diabetes Success Plan  Core Class Satisfaction Survey   . The patient's Newest Vital Sign Health Literacy Assessment score was 57%. . The patient scored 25%on the Diabetes Knowledge pre-test. . Educational strategies utilized during class included repetition, teach-back, eliminating medical jargon, and being open to questions.   Follow-Up Plan:  Attend core 2

## 2017-01-20 ENCOUNTER — Ambulatory Visit: Payer: 59

## 2017-01-27 ENCOUNTER — Ambulatory Visit: Payer: 59

## 2017-02-05 ENCOUNTER — Ambulatory Visit: Payer: Self-pay | Admitting: *Deleted

## 2017-03-03 ENCOUNTER — Encounter: Payer: Self-pay | Admitting: Podiatry

## 2017-03-03 ENCOUNTER — Ambulatory Visit (INDEPENDENT_AMBULATORY_CARE_PROVIDER_SITE_OTHER): Payer: 59 | Admitting: Podiatry

## 2017-03-03 DIAGNOSIS — M7661 Achilles tendinitis, right leg: Secondary | ICD-10-CM

## 2017-03-03 DIAGNOSIS — M7662 Achilles tendinitis, left leg: Secondary | ICD-10-CM | POA: Diagnosis not present

## 2017-03-04 NOTE — Progress Notes (Signed)
She presents today for follow-up of her Achilles tendinitis bilaterally. She states that the right foot is really hurting the left was not doing too bad. She states that she has started exercising again and it started to hurt. She states that maybe she was overdoing it.  Objective: Vital signs are stable she is alert and oriented 3. Pulses are palpable. She is pain on palpation posterior aspect of her right Achilles. Achilles appears to be intact the area is mildly warm to touch.  Assessment: Achilles tendinitis.  Plan: Continue meloxicam injected the right posterior Achilles area making sure not to inject into the Achilles itself it was just superior to the insertion site. An injected into the subcutaneous tissue. Only 2 mg of dexamethasone and local anesthetic was utilized.

## 2017-03-31 DIAGNOSIS — E1165 Type 2 diabetes mellitus with hyperglycemia: Secondary | ICD-10-CM | POA: Diagnosis not present

## 2017-04-01 DIAGNOSIS — E1165 Type 2 diabetes mellitus with hyperglycemia: Secondary | ICD-10-CM | POA: Diagnosis not present

## 2017-04-09 ENCOUNTER — Ambulatory Visit: Payer: Self-pay | Admitting: *Deleted

## 2017-07-09 DIAGNOSIS — M2550 Pain in unspecified joint: Secondary | ICD-10-CM | POA: Diagnosis not present

## 2017-07-09 DIAGNOSIS — E119 Type 2 diabetes mellitus without complications: Secondary | ICD-10-CM | POA: Diagnosis not present

## 2017-07-09 DIAGNOSIS — Z794 Long term (current) use of insulin: Secondary | ICD-10-CM | POA: Diagnosis not present

## 2017-07-13 ENCOUNTER — Other Ambulatory Visit (HOSPITAL_COMMUNITY): Payer: Self-pay | Admitting: General Surgery

## 2017-07-21 ENCOUNTER — Ambulatory Visit: Payer: 59 | Admitting: Psychology

## 2017-07-21 DIAGNOSIS — F509 Eating disorder, unspecified: Secondary | ICD-10-CM | POA: Diagnosis not present

## 2017-07-23 ENCOUNTER — Ambulatory Visit: Payer: Self-pay | Admitting: Registered"

## 2017-07-28 ENCOUNTER — Ambulatory Visit (HOSPITAL_COMMUNITY): Payer: 59

## 2017-07-28 ENCOUNTER — Other Ambulatory Visit (HOSPITAL_COMMUNITY): Payer: 59

## 2017-07-28 ENCOUNTER — Ambulatory Visit (INDEPENDENT_AMBULATORY_CARE_PROVIDER_SITE_OTHER): Payer: 59 | Admitting: Psychology

## 2017-07-28 DIAGNOSIS — F509 Eating disorder, unspecified: Secondary | ICD-10-CM

## 2017-07-30 ENCOUNTER — Encounter: Payer: Self-pay | Admitting: Skilled Nursing Facility1

## 2017-07-30 ENCOUNTER — Encounter: Payer: 59 | Attending: General Surgery | Admitting: Skilled Nursing Facility1

## 2017-07-30 DIAGNOSIS — Z713 Dietary counseling and surveillance: Secondary | ICD-10-CM | POA: Insufficient documentation

## 2017-07-30 DIAGNOSIS — Z6837 Body mass index (BMI) 37.0-37.9, adult: Secondary | ICD-10-CM | POA: Insufficient documentation

## 2017-07-30 DIAGNOSIS — E119 Type 2 diabetes mellitus without complications: Secondary | ICD-10-CM

## 2017-07-30 NOTE — Progress Notes (Signed)
Pre-Op Assessment Visit:  Pre-Operative RYGB Surgery  Medical Nutrition Therapy:  Appt start time: 7:41  End time:  8:35  Patient was seen on 07/30/2017 for Pre-Operative Nutrition Assessment. Assessment and letter of approval faxed to [facility] Bariatric Surgery Program coordinator on 07/30/2017.   Pt states she just found out she has diabetes about 8 months ago. Pt states he checks her blood sugars 4 times a day: fasting: 197, 271,187and post prandial: 203, 193, 112, 170, 250. Pt admits to incorrectly administering her insulin as well as forgetting to take.  Pt states she is getting surgery because she was diagnosed with diabetes. Pt states she signed up for the gym.  Pt did not understand diabetes and had a lot of misconceptions. Pt states if she can control her diabetes she will not need surgery.   Start weight at [facility]: 245.7 BMI: 37.80   24 hr Dietary Recall: First Meal: toast and frosted mini wheats with sugar or bacon and coffee or [place] sausage and peppers and eggs---bacon Snack: sunflower seeds and pistacios  Second Meal: chicken stew and chicken pasta or [place] breat or chicken salad---skipped---tortilla with cheese and peponi Snack: cream cheese and strawberries  Third Meal: [place] and peppers and onion or salad---spaggetti or shrimp alfredo with peppers and onions Snack: Beverages: green tea with milk, cream, and sugar, flavorings in water, coffee, carbonated pink lemonade, sweet tea, soda  Encouraged to engage in 150 minutes of moderate physical activity including cardiovascular and weight baring weekly  Handouts given during visit include:  . Pre-Op Goals . Bariatric Surgery Protein Shakes During the appointment today the following Pre-Op Goals were reviewed with the patient: . Maintain or lose weight as instructed by your surgeon . Make healthy food choices . Begin to limit portion sizes . Limited concentrated sugars and fried foods . Keep fat/sugar in  the single digits per serving on             food labels . Practice CHEWING your food  (aim for 30 chews per bite or until applesauce consistency) . Practice not drinking 15 minutes before, during, and 30 minutes after each meal/snack . Avoid all carbonated beverages  . Avoid/limit caffeinated beverages  . Avoid all sugar-sweetened beverages . Consume 3 meals per day; eat every 3-5 hours . Make a list of non-food related activities . Aim for 64-100 ounces of FLUID daily  . Aim for at least 60-80 grams of PROTEIN daily . Look for a liquid protein source that contain ?15 g protein and ?5 g carbohydrate  (ex: shakes, drinks, shots)  -Follow diet recommendations listed below   Energy and Macronutrient Recomendations: Calories: 1500 Carbohydrate: 170 Protein: 112 Fat: 42  Demonstrated degree of understanding via:  Teach Back  Teaching Method Utilized:  Visual Auditory Hands on  Barriers to learning/adherence to lifestyle change: uneducated in her disease   Patient to call the [facility] to enroll in Pre-Op and Post-Op Nutrition Education when surgery date is scheduled.

## 2017-08-06 ENCOUNTER — Encounter: Payer: Self-pay | Admitting: Skilled Nursing Facility1

## 2017-08-06 ENCOUNTER — Encounter: Payer: 59 | Admitting: Skilled Nursing Facility1

## 2017-08-06 DIAGNOSIS — Z713 Dietary counseling and surveillance: Secondary | ICD-10-CM | POA: Diagnosis not present

## 2017-08-06 DIAGNOSIS — E119 Type 2 diabetes mellitus without complications: Secondary | ICD-10-CM

## 2017-08-06 DIAGNOSIS — Z6837 Body mass index (BMI) 37.0-37.9, adult: Secondary | ICD-10-CM | POA: Diagnosis not present

## 2017-08-06 NOTE — Patient Instructions (Signed)
-  Take your bedtime insulin at the same time every night  -Eat breakfast within 1-1..5 hours of waking  -Eat every 3-5 hours throughout the day

## 2017-08-06 NOTE — Progress Notes (Signed)
RYGB Assessment:  1st [facility] Appointment.  Pt states if she can control her diabetes she will not need surgery.  Pt arrives having lost about 2 pounds. Pt states she is realizing she needs to actually take her insulin because it lowers her blood sugars when she takes it. Pt states when she has high blood sugars she gest headaches. Pt states she was getting busy and not prioritizing herself. Pt states she realized she cooks with sugar and butter in everything. Pt states she does not like vegetables. Pt states she will start at the gym. Pt blood sugars still running over 200 but now that she is starting to take her long acting insulin correctly her sugars are 140-180 fasting. Pt states she is trying to drink more water.   Start weight at [facility]: 245.7 BMI: 37.80  MEDICATIONS: See List   DIETARY INTAKE:  24-hr recall:  B ( AM): oatmeal and bacon Snk ( AM):  L ( PM): beef stew Snk ( PM): seeds and nuts D ( PM): frozen pizza Snk ( PM):  Beverages: juice  Usual physical activity: ADL's  Diet to Follow: 1500 calories 170 g carbohydrates 112 g protein 42 g fat   Nutritional Diagnosis:  [place]-3.3 Overweight/obesity related to past poor dietary habits and physical inactivity as evidenced by patient w/ planned RYGB surgery following dietary guidelines for continued weight loss.    Intervention:  Nutrition counseling for upcoming Bariatric Surgery. Goals: -Encouraged to engage in 150 minutes of moderate physical activity including cardiovascular and weight baring weekly -Take your bedtime insulin at the same time every night -Eat breakfast within 1-1..5 hours of waking -Eat every 3-5 hours throughout the day Teaching Method Utilized:  Visual Auditory Hands on  Handouts given during visit include:  Yellow card  Barriers to learning/adherence to lifestyle change: none identified   Demonstrated degree of understanding via:  Teach Back   Monitoring/Evaluation:  Dietary intake, exercise,and  body weight prn.

## 2017-08-13 ENCOUNTER — Ambulatory Visit (HOSPITAL_COMMUNITY)
Admission: RE | Admit: 2017-08-13 | Discharge: 2017-08-13 | Disposition: A | Discharge status: Home / Self Care | Payer: 59 | Source: Ambulatory Visit | Attending: General Surgery | Admitting: General Surgery

## 2017-08-13 ENCOUNTER — Encounter (HOSPITAL_COMMUNITY): Payer: Self-pay | Admitting: Radiology

## 2017-08-13 ENCOUNTER — Other Ambulatory Visit: Payer: Self-pay

## 2017-08-13 DIAGNOSIS — Z01810 Encounter for preprocedural cardiovascular examination: Secondary | ICD-10-CM | POA: Diagnosis not present

## 2017-08-13 DIAGNOSIS — R001 Bradycardia, unspecified: Secondary | ICD-10-CM | POA: Insufficient documentation

## 2017-08-13 DIAGNOSIS — Z01818 Encounter for other preprocedural examination: Secondary | ICD-10-CM | POA: Diagnosis not present

## 2017-08-13 DIAGNOSIS — K228 Other specified diseases of esophagus: Secondary | ICD-10-CM | POA: Insufficient documentation

## 2017-08-27 ENCOUNTER — Encounter (INDEPENDENT_AMBULATORY_CARE_PROVIDER_SITE_OTHER): Payer: 59

## 2017-09-02 ENCOUNTER — Encounter (INDEPENDENT_AMBULATORY_CARE_PROVIDER_SITE_OTHER): Payer: Self-pay

## 2017-09-02 ENCOUNTER — Ambulatory Visit (INDEPENDENT_AMBULATORY_CARE_PROVIDER_SITE_OTHER): Payer: 59 | Admitting: Family Medicine

## 2017-09-07 NOTE — Patient Instructions (Addendum)
Carly Santiago  09/07/2017   Your procedure is scheduled on: 09-21-17 Monday  Report to [facility] Main  Entrance   Report to admitting at [place]   Call this number if you have problems the morning of surgery [phone]    Remember: Do not eat food or drink liquids :After Midnight.    How to Manage Your Diabetes Before and After Surgery  Why is it important to control my blood sugar before and after surgery? . Improving blood sugar levels before and after surgery helps healing and can limit problems. . A way of improving blood sugar control is eating a healthy diet by: o  Eating less sugar and carbohydrates o  Increasing activity/exercise o  Talking with your doctor about reaching your blood sugar goals . High blood sugars (greater than 180 mg/dL) can raise your risk of infections and slow your recovery, so you will need to focus on controlling your diabetes during the weeks before surgery. . Make sure that the doctor who takes care of your diabetes knows about your planned surgery including the date and location.  How do I manage my blood sugar before surgery? . Check your blood sugar at least 4 times a day, starting 2 days before surgery, to make sure that the level is not too high or low. o Check your blood sugar the morning of your surgery when you wake up and every 2 hours until you get to the Short Stay unit. . If your blood sugar is less than 70 mg/dL, you will need to treat for low blood sugar: o Do not take insulin. o Treat a low blood sugar (less than 70 mg/dL) with  cup of clear juice (cranberry or apple), 4 glucose tablets, OR glucose gel. o Recheck blood sugar in 15 minutes after treatment (to make sure it is greater than 70 mg/dL). If your blood sugar is not greater than 70 mg/dL on recheck, call [phone] for further instructions. . Report your blood sugar to the short stay nurse when you get to Short  Stay.  . If you are admitted to the hospital after surgery: o Your blood sugar will be checked by the staff and you will probably be given insulin after surgery (instead of oral diabetes medicines) to make sure you have good blood sugar levels. o The goal for blood sugar control after surgery is 80-180 mg/dL.   WHAT DO I DO ABOUT MY DIABETES MEDICATION?      May take 1/2 dose of Fiasp insulin the night before at supper if needed.!  . THE NIGHT BEFORE SURGERY, take  22   units of    Tresiba   insulin.       . THE MORNING OF SURGERY, take    0 units of  Fiasp       insulin.     DO NOT TAKE ANY DIABETIC MEDICATIONS THE MORNING OF YOUR SURGERY!              ________________________________________________________________________    Take these medicines the morning of surgery with A SIP OF WATER: NONE                                You may not have any metal on your body including hair pins and  piercings  Do not wear jewelry, make-up, lotions, powders or perfumes, deodorant             Do not wear nail polish.  Do not shave  48 hours prior to surgery.                Do not bring valuables to the hospital. [place].  Contacts, dentures or bridgework may not be worn into surgery.  Leave suitcase in the car. After surgery it may be brought to your room.                Please read over the following fact sheets you were given: _____________________________________________________________________  [facility] - Preparing for Surgery Before surgery, you can play an important role.  Because skin is not sterile, your skin needs to be as free of germs as possible.  You can reduce the number of germs on your skin by washing with CHG (chlorahexidine gluconate) soap before surgery.  CHG is an antiseptic cleaner which kills germs and bonds with the skin to continue killing germs even after washing. Please DO NOT use if you have  an allergy to CHG or antibacterial soaps.  If your skin becomes reddened/irritated stop using the CHG and inform your nurse when you arrive at Short Stay. Do not shave (including legs and underarms) for at least 48 hours prior to the first CHG shower.  You may shave your face/neck. Please follow these instructions carefully:  1.  Shower with CHG Soap the night before surgery and the  morning of Surgery.  2.  If you choose to wash your hair, wash your hair first as usual with your  normal  shampoo.  3.  After you shampoo, rinse your hair and body thoroughly to remove the  shampoo.                           4.  Use CHG as you would any other liquid soap.  You can apply chg directly  to the skin and wash                       Gently with a scrungie or clean washcloth.  5.  Apply the CHG Soap to your body ONLY FROM THE NECK DOWN.   Do not use on face/ open                           Wound or open sores. Avoid contact with eyes, ears mouth and genitals (private parts).                       Wash face,  Genitals (private parts) with your normal soap.             6.  Wash thoroughly, paying special attention to the area where your surgery  will be performed.  7.  Thoroughly rinse your body with warm water from the neck down.  8.  DO NOT shower/wash with your normal soap after using and rinsing off  the CHG Soap.                9.  Pat yourself dry with a clean towel.            10.  Wear  clean pajamas.            11.  Place clean sheets on your bed the night of your first shower and do not  sleep with pets. Day of Surgery : Do not apply any lotions/deodorants the morning of surgery.  Please wear clean clothes to the hospital/surgery center.  FAILURE TO FOLLOW THESE INSTRUCTIONS MAY RESULT IN THE CANCELLATION OF YOUR SURGERY PATIENT SIGNATURE_________________________________  NURSE  SIGNATURE__________________________________  ________________________________________________________________________              Adam Phenix  An incentive spirometer is a tool that can help keep your lungs clear and active. This tool measures how well you are filling your lungs with each breath. Taking long deep breaths may help reverse or decrease the chance of developing breathing (pulmonary) problems (especially infection) following:  A long period of time when you are unable to move or be active. BEFORE THE PROCEDURE   If the spirometer includes an indicator to show your best effort, your nurse or respiratory therapist will set it to a desired goal.  If possible, sit up straight or lean slightly forward. Try not to slouch.  Hold the incentive spirometer in an upright position. INSTRUCTIONS FOR USE  1. Sit on the edge of your bed if possible, or sit up as far as you can in bed or on a chair. 2. Hold the incentive spirometer in an upright position. 3. Breathe out normally. 4. Place the mouthpiece in your mouth and seal your lips tightly around it. 5. Breathe in slowly and as deeply as possible, raising the piston or the ball toward the top of the column. 6. Hold your breath for 3-5 seconds or for as long as possible. Allow the piston or ball to fall to the bottom of the column. 7. Remove the mouthpiece from your mouth and breathe out normally. 8. Rest for a few seconds and repeat Steps 1 through 7 at least 10 times every 1-2 hours when you are awake. Take your time and take a few normal breaths between deep breaths. 9. The spirometer may include an indicator to show your best effort. Use the indicator as a goal to work toward during each repetition. 10. After each set of 10 deep breaths, practice coughing to be sure your lungs are clear. If you have an incision (the cut made at the time of surgery), support your incision when coughing by placing a pillow or rolled up towels  firmly against it. Once you are able to get out of bed, walk around indoors and cough well. You may stop using the incentive spirometer when instructed by your caregiver.  RISKS AND COMPLICATIONS  Take your time so you do not get dizzy or light-headed.  If you are in pain, you may need to take or ask for pain medication before doing incentive spirometry. It is harder to take a deep breath if you are having pain. AFTER USE  Rest and breathe slowly and easily.  It can be helpful to keep track of a log of your progress. Your caregiver can provide you with a simple table to help with this. If you are using the spirometer at home, follow these instructions: [place] IF:   You are having difficultly using the spirometer.  You have trouble using the spirometer as often as instructed.  Your pain medication is not giving enough relief while using the spirometer.  You develop fever of 100.5 F (38.1 C) or higher. [place]  CARE IF:   You cough up bloody sputum that had not been present before.  You develop fever of 102 F (38.9 C) or greater.  You develop worsening pain at or near the incision site. MAKE SURE YOU:   Understand these instructions.  Will watch your condition.  Will get help right away if you are not doing well or get worse. Document Released: 09/08/2006 Document Revised: 07/21/2011 Document Reviewed: 11/09/2006 [facility] Patient Information 2014 [place], [place].   ________________________________________________________________________

## 2017-09-07 NOTE — Progress Notes (Signed)
CHEST XRAY 08-13-17 Epic EKG 08-13-17 EPIC

## 2017-09-08 ENCOUNTER — Encounter (HOSPITAL_COMMUNITY): Payer: Self-pay

## 2017-09-08 ENCOUNTER — Encounter (HOSPITAL_COMMUNITY)
Admission: RE | Admit: 2017-09-08 | Discharge: 2017-09-08 | Disposition: A | Discharge status: Home / Self Care | Payer: 59 | Source: Ambulatory Visit | Attending: General Surgery | Admitting: General Surgery

## 2017-09-08 ENCOUNTER — Other Ambulatory Visit: Payer: Self-pay

## 2017-09-08 DIAGNOSIS — Z01812 Encounter for preprocedural laboratory examination: Secondary | ICD-10-CM | POA: Insufficient documentation

## 2017-09-08 LAB — BASIC METABOLIC PANEL
Anion gap: 9 (ref 5–15)
BUN: 7 mg/dL (ref 6–20)
CHLORIDE: 104 mmol/L (ref 101–111)
CO2: 26 mmol/L (ref 22–32)
CREATININE: 0.75 mg/dL (ref 0.44–1.00)
Calcium: 9.2 mg/dL (ref 8.9–10.3)
Glucose, Bld: 194 mg/dL — ABNORMAL HIGH (ref 65–99)
Potassium: 4 mmol/L (ref 3.5–5.1)
SODIUM: 139 mmol/L (ref 135–145)

## 2017-09-08 LAB — GLUCOSE, CAPILLARY: Glucose-Capillary: 201 mg/dL — ABNORMAL HIGH (ref 65–99)

## 2017-09-08 LAB — CBC
HCT: 37.8 % (ref 36.0–46.0)
HEMOGLOBIN: 12.4 g/dL (ref 12.0–15.0)
MCH: 29.4 pg (ref 26.0–34.0)
MCHC: 32.8 g/dL (ref 30.0–36.0)
MCV: 89.6 fL (ref 78.0–100.0)
PLATELETS: 336 10*3/uL (ref 150–400)
RBC: 4.22 MIL/uL (ref 3.87–5.11)
RDW: 13.6 % (ref 11.5–15.5)
WBC: 5.3 10*3/uL (ref 4.0–10.5)

## 2017-09-08 LAB — HCG, SERUM, QUALITATIVE: PREG SERUM: NEGATIVE

## 2017-09-08 LAB — HEMOGLOBIN A1C
HEMOGLOBIN A1C: 7.4 % — AB (ref 4.8–5.6)
MEAN PLASMA GLUCOSE: 165.68 mg/dL

## 2017-09-10 ENCOUNTER — Ambulatory Visit: Payer: Self-pay | Admitting: General Surgery

## 2017-09-10 MED FILL — oxyCODONE HCL 5 MG/5ML SOLN: 5 | 2 days supply | Qty: 120 | Fill #0

## 2017-09-16 ENCOUNTER — Telehealth (HOSPITAL_COMMUNITY): Payer: Self-pay

## 2017-09-16 ENCOUNTER — Telehealth: Payer: Self-pay

## 2017-09-16 NOTE — Telephone Encounter (Signed)
Patient provided pre op education via telephone.  Electronic educational material emailed to patient for review.  She has attended her pre op appointment at [facility] for all testing required before surgery.  Contact information provided to patient for any additional questions.  Patient has no questions at this time.

## 2017-09-20 NOTE — Anesthesia Preprocedure Evaluation (Addendum)
Anesthesia Evaluation  Patient identified by MRN, date of birth, ID band Patient awake    Reviewed: Allergy & Precautions, NPO status , Patient's Chart, lab work & pertinent test results  History of Anesthesia Complications Negative for: history of anesthetic complications  Airway Mallampati: I  TM Distance: >3 FB Neck ROM: Full    Dental  (+) Dental Advisory Given, Missing   Pulmonary neg pulmonary ROS,    breath sounds clear to auscultation       Cardiovascular negative cardio ROS   Rhythm:Regular Rate:Normal     Neuro/Psych negative neurological ROS     GI/Hepatic negative GI ROS, Neg liver ROS,   Endo/Other  diabetes (glu 170), Insulin DependentMorbid obesity  Renal/GU negative Renal ROS     Musculoskeletal   Abdominal (+) + obese,   Peds  Hematology negative hematology ROS (+)   Anesthesia Other Findings   Reproductive/Obstetrics                            Anesthesia Physical Anesthesia Plan  ASA: III  Anesthesia Plan: General   Post-op Pain Management:    Induction: Intravenous  PONV Risk Score and Plan: 4 or greater and Ondansetron, Dexamethasone and Scopolamine patch - Pre-op  Airway Management Planned: Oral ETT  Additional Equipment:   Intra-op Plan:   Post-operative Plan: Extubation in OR  Informed Consent: I have reviewed the patients History and Physical, chart, labs and discussed the procedure including the risks, benefits and alternatives for the proposed anesthesia with the patient or authorized representative who has indicated his/her understanding and acceptance.   Dental advisory given  Plan Discussed with: CRNA and Surgeon  Anesthesia Plan Comments: (Plan routine monitors, GETA)        Anesthesia Quick Evaluation

## 2017-09-21 ENCOUNTER — Encounter (HOSPITAL_COMMUNITY): Payer: Self-pay | Admitting: *Deleted

## 2017-09-21 ENCOUNTER — Inpatient Hospital Stay (HOSPITAL_COMMUNITY): Payer: 59 | Admitting: Anesthesiology

## 2017-09-21 ENCOUNTER — Inpatient Hospital Stay (HOSPITAL_COMMUNITY)
Admission: RE | Admit: 2017-09-21 | Discharge: 2017-09-23 | DRG: 621 | Disposition: A | Discharge status: Home / Self Care | Payer: 59 | Source: Ambulatory Visit | Attending: General Surgery | Admitting: General Surgery

## 2017-09-21 ENCOUNTER — Other Ambulatory Visit: Payer: Self-pay

## 2017-09-21 ENCOUNTER — Encounter (HOSPITAL_COMMUNITY)
Admission: RE | Disposition: A | Discharge status: Home / Self Care | Payer: Self-pay | Source: Ambulatory Visit | Attending: General Surgery

## 2017-09-21 DIAGNOSIS — Z8541 Personal history of malignant neoplasm of cervix uteri: Secondary | ICD-10-CM

## 2017-09-21 DIAGNOSIS — E119 Type 2 diabetes mellitus without complications: Secondary | ICD-10-CM | POA: Diagnosis present

## 2017-09-21 DIAGNOSIS — Z794 Long term (current) use of insulin: Secondary | ICD-10-CM | POA: Diagnosis not present

## 2017-09-21 DIAGNOSIS — Z903 Acquired absence of stomach [part of]: Secondary | ICD-10-CM

## 2017-09-21 DIAGNOSIS — M722 Plantar fascial fibromatosis: Secondary | ICD-10-CM | POA: Diagnosis present

## 2017-09-21 DIAGNOSIS — G8929 Other chronic pain: Secondary | ICD-10-CM | POA: Diagnosis present

## 2017-09-21 DIAGNOSIS — Z6837 Body mass index (BMI) 37.0-37.9, adult: Secondary | ICD-10-CM | POA: Diagnosis not present

## 2017-09-21 DIAGNOSIS — D72829 Elevated white blood cell count, unspecified: Secondary | ICD-10-CM | POA: Diagnosis not present

## 2017-09-21 HISTORY — PX: LAPAROSCOPIC GASTRIC SLEEVE RESECTION: SHX5895

## 2017-09-21 HISTORY — DX: Acquired absence of stomach (part of): Z90.3

## 2017-09-21 LAB — COMPREHENSIVE METABOLIC PANEL
ALT: 10 U/L — ABNORMAL LOW (ref 14–54)
ANION GAP: 11 (ref 5–15)
AST: 14 U/L — AB (ref 15–41)
Albumin: 3.8 g/dL (ref 3.5–5.0)
Alkaline Phosphatase: 65 U/L (ref 38–126)
BUN: 8 mg/dL (ref 6–20)
CALCIUM: 9.1 mg/dL (ref 8.9–10.3)
CHLORIDE: 104 mmol/L (ref 101–111)
CO2: 23 mmol/L (ref 22–32)
Creatinine, Ser: 0.81 mg/dL (ref 0.44–1.00)
Glucose, Bld: 182 mg/dL — ABNORMAL HIGH (ref 65–99)
POTASSIUM: 3.8 mmol/L (ref 3.5–5.1)
Sodium: 138 mmol/L (ref 135–145)
Total Bilirubin: 0.4 mg/dL (ref 0.3–1.2)
Total Protein: 7.6 g/dL (ref 6.5–8.1)

## 2017-09-21 LAB — CBC WITH DIFFERENTIAL/PLATELET
BASOS ABS: 0.1 10*3/uL (ref 0.0–0.1)
Basophils Relative: 1 %
Eosinophils Absolute: 0.2 10*3/uL (ref 0.0–0.7)
Eosinophils Relative: 3 %
HCT: 37.6 % (ref 36.0–46.0)
Hemoglobin: 12.6 g/dL (ref 12.0–15.0)
LYMPHS PCT: 41 %
Lymphs Abs: 2.6 10*3/uL (ref 0.7–4.0)
MCH: 29.9 pg (ref 26.0–34.0)
MCHC: 33.5 g/dL (ref 30.0–36.0)
MCV: 89.3 fL (ref 78.0–100.0)
Monocytes Absolute: 0.7 10*3/uL (ref 0.1–1.0)
Monocytes Relative: 10 %
Neutro Abs: 2.9 10*3/uL (ref 1.7–7.7)
Neutrophils Relative %: 45 %
Platelets: 300 10*3/uL (ref 150–400)
RBC: 4.21 MIL/uL (ref 3.87–5.11)
RDW: 13.6 % (ref 11.5–15.5)
WBC: 6.4 10*3/uL (ref 4.0–10.5)

## 2017-09-21 LAB — GLUCOSE, CAPILLARY
GLUCOSE-CAPILLARY: 170 mg/dL — AB (ref 65–99)
GLUCOSE-CAPILLARY: 328 mg/dL — AB (ref 65–99)
GLUCOSE-CAPILLARY: 197 mg/dL — AB (ref 65–99)
Glucose-Capillary: 238 mg/dL — ABNORMAL HIGH (ref 65–99)

## 2017-09-21 LAB — TYPE AND SCREEN
ABO/RH(D): O POS
ANTIBODY SCREEN: NEGATIVE

## 2017-09-21 LAB — ABO/RH: ABO/RH(D): O POS

## 2017-09-21 LAB — HEMOGLOBIN AND HEMATOCRIT, BLOOD
HEMATOCRIT: 36.4 % (ref 36.0–46.0)
Hemoglobin: 12.1 g/dL (ref 12.0–15.0)

## 2017-09-21 SURGERY — GASTRECTOMY, SLEEVE, LAPAROSCOPIC
Anesthesia: General | Site: Abdomen

## 2017-09-21 MED ORDER — FAMOTIDINE IN NACL 20-0.9 MG/50ML-% IV SOLN
20.0000 mg | Freq: Two times a day (BID) | INTRAVENOUS | Status: DC
Start: 2017-09-21 — End: 2017-09-23
  Administered 2017-09-21 – 2017-09-23 (×5): 20 mg via INTRAVENOUS
  Filled 2017-09-21 (×8): qty 50

## 2017-09-21 MED ORDER — LIDOCAINE 2% (20 MG/ML) 5 ML SYRINGE
INTRAMUSCULAR | Status: DC | PRN
  Administered 2017-09-21: 1 mg/kg/h via INTRAVENOUS

## 2017-09-21 MED ORDER — BUPIVACAINE HCL (PF) 0.25 % IJ SOLN
INTRAMUSCULAR | Status: AC
  Filled 2017-09-21: qty 30

## 2017-09-21 MED ORDER — ROCURONIUM BROMIDE 10 MG/ML (PF) SYRINGE
PREFILLED_SYRINGE | INTRAVENOUS | Status: DC | PRN
  Administered 2017-09-21: 50 mg via INTRAVENOUS
  Administered 2017-09-21: 5 mg via INTRAVENOUS
  Administered 2017-09-21: 20 mg via INTRAVENOUS

## 2017-09-21 MED ORDER — LIDOCAINE 2% (20 MG/ML) 5 ML SYRINGE
INTRAMUSCULAR | Status: AC
  Filled 2017-09-21: qty 15

## 2017-09-21 MED ORDER — ENOXAPARIN SODIUM 30 MG/0.3ML ~~LOC~~ SOLN
30.0000 mg | Freq: Two times a day (BID) | SUBCUTANEOUS | Status: DC
  Administered 2017-09-21 – 2017-09-22 (×3): 30 mg via SUBCUTANEOUS
  Filled 2017-09-21 (×4): qty 0.3

## 2017-09-21 MED ORDER — INSULIN GLARGINE 100 UNIT/ML ~~LOC~~ SOLN
15.0000 [IU] | Freq: Every day | SUBCUTANEOUS | Status: DC
  Administered 2017-09-21 – 2017-09-22 (×2): 15 [IU] via SUBCUTANEOUS
  Filled 2017-09-21 (×2): qty 0.15

## 2017-09-21 MED ORDER — KETAMINE HCL 10 MG/ML IJ SOLN
INTRAMUSCULAR | Status: AC
  Filled 2017-09-21: qty 1

## 2017-09-21 MED ORDER — ONDANSETRON HCL 4 MG/2ML IJ SOLN
INTRAMUSCULAR | Status: DC | PRN
  Administered 2017-09-21: 4 mg via INTRAVENOUS

## 2017-09-21 MED ORDER — POTASSIUM CHLORIDE IN NACL 20-0.9 MEQ/L-% IV SOLN
INTRAVENOUS | Status: DC
  Administered 2017-09-21 – 2017-09-22 (×3): via INTRAVENOUS
  Filled 2017-09-21 (×4): qty 1000

## 2017-09-21 MED ORDER — INSULIN ASPART 100 UNIT/ML ~~LOC~~ SOLN
0.0000 [IU] | SUBCUTANEOUS | Status: DC
  Administered 2017-09-21: 5 [IU] via SUBCUTANEOUS
  Administered 2017-09-21: 3 [IU] via SUBCUTANEOUS
  Administered 2017-09-22 (×4): 2 [IU] via SUBCUTANEOUS

## 2017-09-21 MED ORDER — PROPOFOL 10 MG/ML IV BOLUS
INTRAVENOUS | Status: AC
  Filled 2017-09-21: qty 40

## 2017-09-21 MED ORDER — FENTANYL CITRATE (PF) 100 MCG/2ML IJ SOLN
INTRAMUSCULAR | Status: AC
  Filled 2017-09-21: qty 2

## 2017-09-21 MED ORDER — LACTATED RINGERS IR SOLN
Status: DC | PRN
  Administered 2017-09-21: 1000 mL

## 2017-09-21 MED ORDER — BUPIVACAINE LIPOSOME 1.3 % IJ SUSP
20.0000 mL | Freq: Once | INTRAMUSCULAR | Status: AC
  Administered 2017-09-21: 20 mL
  Filled 2017-09-21: qty 20

## 2017-09-21 MED ORDER — KETAMINE HCL 10 MG/ML IJ SOLN
INTRAMUSCULAR | Status: DC | PRN
  Administered 2017-09-21 (×2): 10 mg via INTRAVENOUS

## 2017-09-21 MED ORDER — CHLORHEXIDINE GLUCONATE CLOTH 2 % EX PADS: 6.0000 | MEDICATED_PAD | Freq: Once | CUTANEOUS | Status: DC

## 2017-09-21 MED ORDER — ONDANSETRON HCL 4 MG/2ML IJ SOLN
INTRAMUSCULAR | Status: AC
  Filled 2017-09-21: qty 6

## 2017-09-21 MED ORDER — MEPERIDINE HCL 50 MG/ML IJ SOLN: 6.2500 mg | INTRAMUSCULAR | Status: DC | PRN

## 2017-09-21 MED ORDER — DEXAMETHASONE SODIUM PHOSPHATE 10 MG/ML IJ SOLN
INTRAMUSCULAR | Status: DC | PRN
  Administered 2017-09-21: 10 mg via INTRAVENOUS

## 2017-09-21 MED ORDER — PREMIER PROTEIN SHAKE
2.0000 [oz_av] | ORAL | Status: DC
  Administered 2017-09-22 – 2017-09-23 (×10): 2 [oz_av] via ORAL

## 2017-09-21 MED ORDER — CELECOXIB 200 MG PO CAPS
400.0000 mg | ORAL_CAPSULE | ORAL | Status: AC
  Administered 2017-09-21: 400 mg via ORAL
  Filled 2017-09-21: qty 2

## 2017-09-21 MED ORDER — OXYCODONE HCL 5 MG/5ML PO SOLN
5.0000 mg | ORAL | Status: DC | PRN
  Administered 2017-09-21: 10 mg via ORAL
  Administered 2017-09-21: 5 mg via ORAL
  Administered 2017-09-22 – 2017-09-23 (×4): 10 mg via ORAL
  Filled 2017-09-21: qty 10
  Filled 2017-09-21: qty 5
  Filled 2017-09-21 (×5): qty 10

## 2017-09-21 MED ORDER — MIDAZOLAM HCL 5 MG/5ML IJ SOLN
INTRAMUSCULAR | Status: DC | PRN
  Administered 2017-09-21: 2 mg via INTRAVENOUS

## 2017-09-21 MED ORDER — LACTATED RINGERS IV SOLN: INTRAVENOUS | Status: DC | PRN

## 2017-09-21 MED ORDER — GABAPENTIN 300 MG PO CAPS
300.0000 mg | ORAL_CAPSULE | ORAL | Status: AC
  Administered 2017-09-21: 300 mg via ORAL
  Filled 2017-09-21: qty 1

## 2017-09-21 MED ORDER — HYDROMORPHONE HCL 1 MG/ML IJ SOLN
INTRAMUSCULAR | Status: AC
  Filled 2017-09-21: qty 1

## 2017-09-21 MED ORDER — APREPITANT 40 MG PO CAPS
40.0000 mg | ORAL_CAPSULE | ORAL | Status: AC
  Administered 2017-09-21: 40 mg via ORAL
  Filled 2017-09-21: qty 1

## 2017-09-21 MED ORDER — LIDOCAINE 2% (20 MG/ML) 5 ML SYRINGE
INTRAMUSCULAR | Status: DC | PRN
  Administered 2017-09-21: 80 mg via INTRAVENOUS

## 2017-09-21 MED ORDER — HEPARIN SODIUM (PORCINE) 5000 UNIT/ML IJ SOLN
5000.0000 [IU] | INTRAMUSCULAR | Status: AC
  Administered 2017-09-21: 5000 [IU] via SUBCUTANEOUS
  Filled 2017-09-21: qty 1

## 2017-09-21 MED ORDER — EVICEL 5 ML EX KIT
PACK | Freq: Once | CUTANEOUS | Status: AC
  Administered 2017-09-21: 1
  Filled 2017-09-21: qty 1

## 2017-09-21 MED ORDER — MIDAZOLAM HCL 2 MG/2ML IJ SOLN
INTRAMUSCULAR | Status: AC
  Filled 2017-09-21: qty 2

## 2017-09-21 MED ORDER — PROMETHAZINE HCL 25 MG/ML IJ SOLN: 6.2500 mg | INTRAMUSCULAR | Status: DC | PRN

## 2017-09-21 MED ORDER — DEXAMETHASONE SODIUM PHOSPHATE 10 MG/ML IJ SOLN
INTRAMUSCULAR | Status: AC
  Filled 2017-09-21: qty 3

## 2017-09-21 MED ORDER — 0.9 % SODIUM CHLORIDE (POUR BTL) OPTIME
TOPICAL | Status: DC | PRN
  Administered 2017-09-21: 1000 mL

## 2017-09-21 MED ORDER — INSULIN ASPART 100 UNIT/ML ~~LOC~~ SOLN
SUBCUTANEOUS | Status: AC
  Filled 2017-09-21: qty 1

## 2017-09-21 MED ORDER — MIDAZOLAM HCL 2 MG/2ML IJ SOLN: 0.5000 mg | Freq: Once | INTRAMUSCULAR | Status: DC | PRN

## 2017-09-21 MED ORDER — ROCURONIUM BROMIDE 10 MG/ML (PF) SYRINGE
PREFILLED_SYRINGE | INTRAVENOUS | Status: AC
  Filled 2017-09-21: qty 15

## 2017-09-21 MED ORDER — DEXAMETHASONE SODIUM PHOSPHATE 4 MG/ML IJ SOLN: 4.0000 mg | INTRAMUSCULAR | Status: DC

## 2017-09-21 MED ORDER — HYDROMORPHONE HCL 1 MG/ML IJ SOLN
0.2500 mg | INTRAMUSCULAR | Status: DC | PRN
  Administered 2017-09-21 (×4): 0.25 mg via INTRAVENOUS

## 2017-09-21 MED ORDER — ONDANSETRON HCL 4 MG/2ML IJ SOLN
4.0000 mg | INTRAMUSCULAR | Status: DC | PRN
  Administered 2017-09-21 – 2017-09-22 (×4): 4 mg via INTRAVENOUS
  Filled 2017-09-21 (×4): qty 2

## 2017-09-21 MED ORDER — BUPIVACAINE HCL (PF) 0.25 % IJ SOLN
INTRAMUSCULAR | Status: DC | PRN
  Administered 2017-09-21: 30 mL

## 2017-09-21 MED ORDER — FENTANYL CITRATE (PF) 100 MCG/2ML IJ SOLN
INTRAMUSCULAR | Status: DC | PRN
  Administered 2017-09-21: 100 ug via INTRAVENOUS

## 2017-09-21 MED ORDER — SODIUM CHLORIDE 0.9 % IJ SOLN
INTRAMUSCULAR | Status: DC | PRN
  Administered 2017-09-21: 50 mL

## 2017-09-21 MED ORDER — LACTATED RINGERS IV SOLN
INTRAVENOUS | Status: DC
  Administered 2017-09-21 (×2): via INTRAVENOUS

## 2017-09-21 MED ORDER — EPHEDRINE SULFATE-NACL 50-0.9 MG/10ML-% IV SOSY
PREFILLED_SYRINGE | INTRAVENOUS | Status: DC | PRN
  Administered 2017-09-21 (×4): 5 mg via INTRAVENOUS

## 2017-09-21 MED ORDER — LIDOCAINE 2% (20 MG/ML) 5 ML SYRINGE: INTRAMUSCULAR | Status: DC | PRN

## 2017-09-21 MED ORDER — PROPOFOL 10 MG/ML IV BOLUS
INTRAVENOUS | Status: DC | PRN
  Administered 2017-09-21: 200 mg via INTRAVENOUS

## 2017-09-21 MED ORDER — LIDOCAINE HCL 2 % IJ SOLN
INTRAMUSCULAR | Status: AC
  Filled 2017-09-21: qty 20

## 2017-09-21 MED ORDER — CEFOTETAN DISODIUM-DEXTROSE 2-2.08 GM-%(50ML) IV SOLR
2.0000 g | INTRAVENOUS | Status: AC
  Administered 2017-09-21: 2 g via INTRAVENOUS
  Filled 2017-09-21: qty 50

## 2017-09-21 MED ORDER — STERILE WATER FOR IRRIGATION IR SOLN
Status: DC | PRN
  Administered 2017-09-21: 1000 mL

## 2017-09-21 MED ORDER — SODIUM CHLORIDE 0.9 % IJ SOLN
INTRAMUSCULAR | Status: AC
  Filled 2017-09-21: qty 50

## 2017-09-21 MED ORDER — INSULIN ASPART 100 UNIT/ML ~~LOC~~ SOLN
SUBCUTANEOUS | Status: AC
  Administered 2017-09-21: 11 [IU] via SUBCUTANEOUS
  Filled 2017-09-21: qty 1

## 2017-09-21 MED ORDER — MORPHINE SULFATE (PF) 4 MG/ML IV SOLN: 1.0000 mg | INTRAVENOUS | Status: DC | PRN

## 2017-09-21 MED ORDER — SUGAMMADEX SODIUM 500 MG/5ML IV SOLN
INTRAVENOUS | Status: DC | PRN
  Administered 2017-09-21: 300 mg via INTRAVENOUS

## 2017-09-21 MED ORDER — SCOPOLAMINE 1 MG/3DAYS TD PT72
1.0000 | MEDICATED_PATCH | TRANSDERMAL | Status: DC
  Administered 2017-09-21: 1.5 mg via TRANSDERMAL
  Filled 2017-09-21: qty 1

## 2017-09-21 MED ORDER — ACETAMINOPHEN 160 MG/5ML PO SOLN
650.0000 mg | Freq: Four times a day (QID) | ORAL | Status: DC
  Administered 2017-09-21 – 2017-09-22 (×5): 650 mg via ORAL
  Filled 2017-09-21 (×7): qty 20.3

## 2017-09-21 SURGICAL SUPPLY — 69 items
ADH SKN CLS APL DERMABOND .7 (GAUZE/BANDAGES/DRESSINGS) ×1
APPLICATOR COTTON TIP 6IN STRL (MISCELLANEOUS) IMPLANT
APPLIER CLIP ROT 10 11.4 M/L (STAPLE)
APPLIER CLIP ROT 13.4 12 LRG (CLIP)
APR CLP LRG 13.4X12 ROT 20 MLT (CLIP)
APR CLP MED LRG 11.4X10 (STAPLE)
BLADE SURG SZ11 CARB STEEL (BLADE) ×2 IMPLANT
CABLE HIGH FREQUENCY MONO STRZ (ELECTRODE) ×2 IMPLANT
CHLORAPREP W/TINT 26ML (MISCELLANEOUS) ×2 IMPLANT
CLIP APPLIE ROT 10 11.4 M/L (STAPLE) IMPLANT
CLIP APPLIE ROT 13.4 12 LRG (CLIP) IMPLANT
DERMABOND ADVANCED (GAUZE/BANDAGES/DRESSINGS) ×1
DERMABOND ADVANCED .7 DNX12 (GAUZE/BANDAGES/DRESSINGS) ×1 IMPLANT
DEVICE SUT QUICK LOAD TK 5 (STAPLE) IMPLANT
DEVICE SUT TI-KNOT TK 5X26 (MISCELLANEOUS) IMPLANT
DEVICE SUTURE ENDOST 10MM (ENDOMECHANICALS) IMPLANT
DRAPE UTILITY XL STRL (DRAPES) ×4 IMPLANT
ELECT REM PT RETURN 15FT ADLT (MISCELLANEOUS) ×2 IMPLANT
GAUZE SPONGE 4X4 12PLY STRL (GAUZE/BANDAGES/DRESSINGS) IMPLANT
GLOVE BIOGEL PI IND STRL 6.5 (GLOVE) IMPLANT
GLOVE BIOGEL PI IND STRL 7.0 (GLOVE) IMPLANT
GLOVE BIOGEL PI IND STRL 7.5 (GLOVE) ×1 IMPLANT
GLOVE BIOGEL PI INDICATOR 6.5 (GLOVE) ×2
GLOVE BIOGEL PI INDICATOR 7.0 (GLOVE) ×2
GLOVE BIOGEL PI INDICATOR 7.5 (GLOVE) ×2
GLOVE ECLIPSE 7.5 STRL STRAW (GLOVE) ×2 IMPLANT
GOWN STRL REUS W/ TWL LRG LVL3 (GOWN DISPOSABLE) IMPLANT
GOWN STRL REUS W/TWL LRG LVL3 (GOWN DISPOSABLE) ×2
GOWN STRL REUS W/TWL XL LVL3 (GOWN DISPOSABLE) ×7 IMPLANT
GRASPER SUT TROCAR 14GX15 (MISCELLANEOUS) IMPLANT
HOVERMATT SINGLE USE (MISCELLANEOUS) ×2 IMPLANT
KIT BASIN OR (CUSTOM PROCEDURE TRAY) ×2 IMPLANT
MARKER SKIN DUAL TIP RULER LAB (MISCELLANEOUS) ×2 IMPLANT
NDL SPNL 22GX3.5 QUINCKE BK (NEEDLE) ×1 IMPLANT
NEEDLE SPNL 22GX3.5 QUINCKE BK (NEEDLE) ×2 IMPLANT
PACK UNIVERSAL I (CUSTOM PROCEDURE TRAY) ×2 IMPLANT
RELOAD STAPLE 60 3.6 BLU REG (STAPLE) ×1 IMPLANT
RELOAD STAPLE 60 3.8 GOLD REG (STAPLE) ×1 IMPLANT
RELOAD STAPLE 60 4.1 GRN THCK (STAPLE) ×1 IMPLANT
RELOAD STAPLER BLUE 60MM (STAPLE) ×2 IMPLANT
RELOAD STAPLER GOLD 60MM (STAPLE) ×2 IMPLANT
RELOAD STAPLER GREEN 60MM (STAPLE) ×1 IMPLANT
SCISSORS LAP 5X45 EPIX DISP (ENDOMECHANICALS) ×1 IMPLANT
SET IRRIG TUBING LAPAROSCOPIC (IRRIGATION / IRRIGATOR) ×2 IMPLANT
SHEARS HARMONIC ACE PLUS 45CM (MISCELLANEOUS) ×2 IMPLANT
SLEEVE ADV FIXATION 5X100MM (TROCAR) ×2 IMPLANT
SLEEVE GASTRECTOMY 36FR VISIGI (MISCELLANEOUS) ×2 IMPLANT
SOLUTION ANTI FOG 6CC (MISCELLANEOUS) ×2 IMPLANT
SPONGE LAP 18X18 RF (DISPOSABLE) ×2 IMPLANT
STAPLER ECHELON LONG 60 440 (INSTRUMENTS) ×2 IMPLANT
STAPLER RELOAD BLUE 60MM (STAPLE) ×4
STAPLER RELOAD GOLD 60MM (STAPLE) ×4
STAPLER RELOAD GREEN 60MM (STAPLE) ×2
SUT MNCRL AB 4-0 PS2 18 (SUTURE) ×2 IMPLANT
SUT SURGIDAC NAB ES-9 0 48 120 (SUTURE) ×2 IMPLANT
SUT VIC AB 0 BRD 54 (SUTURE) IMPLANT
SYR 10ML ECCENTRIC (SYRINGE) ×2 IMPLANT
SYR 20CC LL (SYRINGE) ×4 IMPLANT
SYSTEM WECK SHIELD CLOSURE (TROCAR) IMPLANT
TIP RIGID 35CM EVICEL (HEMOSTASIS) ×2 IMPLANT
TOWEL OR 17X26 10 PK STRL BLUE (TOWEL DISPOSABLE) ×2 IMPLANT
TOWEL OR NON WOVEN STRL DISP B (DISPOSABLE) ×2 IMPLANT
TROCAR ADV FIXATION 12X100MM (TROCAR) ×1 IMPLANT
TROCAR ADV FIXATION 5X100MM (TROCAR) ×2 IMPLANT
TROCAR BLADELESS 15MM (ENDOMECHANICALS) ×2 IMPLANT
TROCAR BLADELESS OPT 5 100 (ENDOMECHANICALS) ×2 IMPLANT
TUBING CONNECTING 10 (TUBING) ×3 IMPLANT
TUBING ENDO SMARTCAP PENTAX (MISCELLANEOUS) ×2 IMPLANT
TUBING INSUF HEATED (TUBING) ×2 IMPLANT

## 2017-09-21 NOTE — Op Note (Signed)
Preoperative Diagnosis: Morbid Obesity, DM II  Postoprative Diagnosis: Morbid Obesity, DM II  Procedure: Procedure(s): LAPAROSCOPIC GASTRIC SLEEVE RESECTION, Upper Endo   Surgeon: Excell Seltzer T   Assistants: Alphonsa Overall  Anesthesia:  General endotracheal anesthesia  Indications: Patient with progressive morbid obesity unresponsive to multiple efforts at medical management who presents with a BMI of 37.6 and comorbidities of insulin-dependent diabetes mellitus and chronic joint pain.  After extensive preoperative work-up and discussion regarding alternatives and risks she has elected to proceed with laparoscopic sleeve gastrectomy for surgical treatment of her morbid obesity..     Procedure Detail: Patient was brought to the operating room, placed in the supine position on the operating table, and general endotracheal anesthesia induced.  She received preoperative IV antibiotics and subcutaneous heparin.  PAS were in place.  The abdomen was widely sterilely prepped and draped.  Patient timeout was performed and correct procedure verified.  Access was obtained with a 5 mm Optiview trocar in the left upper quadrant without difficulty and pneumoperitoneum established.  No evidence of trocar injury.  Under direct vision a 5 mm trocar was placed laterally in the right upper quadrant and a 15 mm trocar in the upper right abdomen near the base of the falciform ligament.  A 5 mm port for the camera was placed just above into the left of the umbilicus and one additional 5 mm port in the left upper quadrant.  Patient was placed in steep reverse Trendelenburg through a 5 mm subxiphoid site the [facility] retractor was placed in the left lobe of the lip of her elevated with excellent exposure of the stomach and the hiatus.  A bilateral T AP block was performed with dilute Exparel under direct vision.  We began dissection along the mid greater curve with Harmonic Scalpel dividing vasculature adjacent to  the stomach and the lesser sac was entered.  The dissection progressed proximally and short gastric vessels were divided with Harmonic Scalpel mobilizing the fundus.  There were no significant attachments to the spleen.  The dissection progressed proximally and the fundus was completely mobilized and the left crus fully dissected and exposed up to the hiatus.  There was no evidence of hiatal hernia.  The patient does not have reflux and had a normal upper GI series.  After full mobilization of the fundus we dissected distally along the greater curve mobilizing 2.5 cm from the pylorus.  Some avascular posterior attachments were divided completely freeing the stomach along its lesser curve vasculature.  The 43 French VISI G-tube was passed orally and advanced with its tip to the pylorus and positioned along the lesser curve and placed on continuous suction.  The sleeve was begun with an initial firing of the 60 mm green load stapler.  The 5 mm optical entry site was upsized to 12 mm to get a better angle for the stapler.  It was then continued with 2 firings of the 60 mm gold load stapler staying away from the tube and allowing extra room until we had advanced past the incisura.  Following this the sleeve was completed with 3 further firings of the blue load 60 mm stapler staying closer to the VISI G-tube but not tight against it and with the final firing going just lateral to the esophageal fat pad at right angles to the gastric wall.  The sleeve was insufflated and appeared symmetrical without twisting or narrowing and no evidence of leak.  Dr. Lucia Gaskins performed upper endoscopy showing a symmetrical tubular sleeve  with no narrowing and no bleeding or evidence of leak.  There was no evidence of bleeding or injury or other problems.  The staple line was coated with Evaseal.  The gastric specimen was brought out through the 15 mm trocar site after dilating this slightly and this fascial defect was closed with 0 Vicryl  using the Weck closure device.  The Nathanson retractor was removed under direct vision.  All CO2 was evacuated and trochars removed.  Skin incisions were closed with subcuticular 5-0 Monocryl and Dermabond.  Sponge needle and instrument counts were correct.    Findings: As above  Estimated Blood Loss:  less than 50 mL         Drains: None  Blood Given: none          Specimens: Greater curvature of stomach        Complications:  * No complications entered in OR log *         Disposition: PACU - hemodynamically stable.         Condition: stable

## 2017-09-21 NOTE — Anesthesia Procedure Notes (Signed)
Procedure Name: Intubation Date/Time: 09/21/2017 7:20 AM Performed by: Lavina Hamman, CRNA Pre-anesthesia Checklist: Patient identified, Emergency Drugs available, Suction available and Patient being monitored Patient Re-evaluated:Patient Re-evaluated prior to induction Oxygen Delivery Method: Circle system utilized Preoxygenation: Pre-oxygenation with 100% oxygen Induction Type: IV induction and Cricoid Pressure applied Ventilation: Mask ventilation without difficulty Laryngoscope Size: Mac and 4 Grade View: Grade I Tube type: Oral Tube size: 7.5 mm Number of attempts: 1 Airway Equipment and Method: Stylet Placement Confirmation: ETT inserted through vocal cords under direct vision Secured at: 21 cm Tube secured with: Tape

## 2017-09-21 NOTE — Interval H&P Note (Signed)
History and Physical Interval Note:  09/21/2017 7:04 AM  Carly Santiago  has presented today for surgery, with the diagnosis of Morbid Obesity, DM II  The various methods of treatment have been discussed with the patient and family. After consideration of risks, benefits and other options for treatment, the patient has consented to  Procedure(s): LAPAROSCOPIC GASTRIC SLEEVE RESECTION, Upper Endo, ERAS Pathway (N/A) as a surgical intervention .  The patient's history has been reviewed, patient examined, no change in status, stable for surgery.  I have reviewed the patient's chart and labs.  Questions were answered to the patient's satisfaction.     Darene Lamer Maty Zeisler

## 2017-09-21 NOTE — Discharge Instructions (Signed)
° ° ° °GASTRIC BYPASS/SLEEVE ° Home Care Instructions ° ° These instructions are to help you care for yourself when you go home. ° °Call: If you have any problems. °• Call [phone] and ask for the surgeon on call °• If you need immediate help, come to the ER at [facility].  °• Tell the ER staff that you are a new post-op gastric bypass or gastric sleeve patient °  °Signs and symptoms to report: • Severe vomiting or nausea °o If you cannot keep down clear liquids for longer than 1 day, call your surgeon  °• Abdominal pain that does not get better after taking your pain medication °• Fever over 100.4° F with chills °• Heart beating over 100 beats a minute °• Shortness of breath at rest °• Chest pain °•  Redness, swelling, drainage, or foul odor at incision (surgical) sites °•  If your incisions open or pull apart °• Swelling or pain in calf (lower leg) °• Diarrhea (Loose bowel movements that happen often), frequent watery, uncontrolled bowel movements °• Constipation, (no bowel movements for 3 days) if this happens: Pick one °o Milk of Magnesia, 2 tablespoons by mouth, 3 times a day for 2 days if needed °o Stop taking Milk of Magnesia once you have a bowel movement °o Call your doctor if constipation continues °Or °o Miralax  (instead of Milk of Magnesia) following the label instructions °o Stop taking Miralax once you have a bowel movement °o Call your doctor if constipation continues °• Anything you think is not normal °  °Normal side effects after surgery: • Unable to sleep at night or unable to focus °• Irritability or moody °• Being tearful (crying) or depressed °These are common complaints, possibly related to your anesthesia medications that put you to sleep, stress of surgery, and change in lifestyle.  This usually goes away a few weeks after surgery.  If these feelings continue, call your primary care doctor. °  °Wound Care: You may have surgical glue, steri-strips, or staples over your incisions after  surgery °• Surgical glue:  Looks like a clear film over your incisions and will wear off a little at a time °• Steri-strips: Strips of tape over your incisions. You may notice a yellowish color on the skin under the steri-strips. This is used to make the   steri-strips stick better. Do not pull the steri-strips off - let them fall off °• Staples: Staples may be removed before you leave the hospital °o If you go home with staples, call [facility], [phone] at for an appointment with your surgeon’s nurse to have staples removed 10 days after surgery. °• Showering: You may shower two (2) days after your surgery unless your surgeon tells you differently °o Wash gently around incisions with warm soapy water, rinse well, and gently pat dry  °o No tub baths until staples are removed, steri-strips fall off or glue is gone.  °  °Medications: • Medications should be liquid or crushed if larger than the size of a dime °• Extended release pills (medication that release a little bit at a time through the day) should NOT be crushed or cut. (examples include XL, ER, DR, SR) °• Depending on the size and number of medications you take, you may need to space (take a few throughout the day)/change the time you take your medications so that you do not over-fill your pouch (smaller stomach) °• Make sure you follow-up with your primary care doctor to   make medication changes needed during rapid weight loss and life-style changes °• If you have diabetes, follow up with the doctor that orders your diabetes medication(s) within one week after surgery and check your blood sugar regularly. °• Do not drive while taking prescription pain medication  °• It is ok to take Tylenol by the bottle instructions with your pain medicine or instead of your pain medicine as needed.  DO NOT TAKE NSAIDS (EXAMPLES OF NSAIDS:  IBUPROFREN/ NAPROXEN)  °Diet:                    First 2 Weeks ° You will see the dietician t about two (2) weeks  after your surgery. The dietician will increase the types of foods you can eat if you are handling liquids well: °• If you have severe vomiting or nausea and cannot keep down clear liquids lasting longer than 1 day, call your surgeon @ ([phone]) °Protein Shake °• Drink at least 2 ounces of shake 5-6 times per day °• Each serving of protein shakes (usually 8 - 12 ounces) should have: °o 15 grams of protein  °o And no more than 5 grams of carbohydrate  °• Goal for protein each day: °o Men = 80 grams per day °o Women = 60 grams per day °• Protein powder may be added to fluids such as non-fat milk or Lactaid milk or unsweetened Soy/Almond milk (limit to 35 grams added protein powder per serving) ° °Hydration °• Slowly increase the amount of water and other clear liquids as tolerated (See Acceptable Fluids) °• Slowly increase the amount of protein shake as tolerated  °•  Sip fluids slowly and throughout the day.  Do not use straws. °• May use sugar substitutes in small amounts (no more than 6 - 8 packets per day; i.e. Splenda) ° °Fluid Goal °• The first goal is to drink at least 8 ounces of protein shake/drink per day (or as directed by the nutritionist); some examples of protein shakes are Syntrax Nectar, Adkins Advantage, EAS Edge HP, and Unjury. See handout from pre-op Bariatric Education Class: °o Slowly increase the amount of protein shake you drink as tolerated °o You may find it easier to slowly sip shakes throughout the day °o It is important to get your proteins in first °• Your fluid goal is to drink 64 - 100 ounces of fluid daily °o It may take a few weeks to build up to this °• 32 oz (or more) should be clear liquids  °And  °• 32 oz (or more) should be full liquids (see below for examples) °• Liquids should not contain sugar, caffeine, or carbonation ° °Clear Liquids: °• Water or Sugar-free flavored water (i.e. Fruit H2O, Propel) °• Decaffeinated coffee or tea (sugar-free) °• Crystal Lite, Wyler’s Lite,  Minute Maid Lite °• Sugar-free Jell-O °• Bouillon or broth °• Sugar-free Popsicle:   *Less than 20 calories each; Limit 1 per day ° °Full Liquids: °Protein Shakes/Drinks + 2 choices per day of other full liquids °• Full liquids must be: °o No More Than 15 grams of Carbs per serving  °o No More Than 3 grams of Fat per serving °• Strained low-fat cream soup (except Cream of Potato or Tomato) °• Non-Fat milk °• Fat-free Lactaid Milk °• Unsweetened Soy Or Unsweetened Almond Milk °• Low Sugar yogurt (Dannon Lite & Fit, Greek yogurt; Oikos Triple Zero; Chobani Simply 100; Yoplait 100 calorie Greek - No Fruit on the Bottom) ° °  °Vitamins   and Minerals • Start 1 day after surgery unless otherwise directed by your surgeon °• 2 Chewable Bariatric Specific Multivitamin / Multimineral Supplement with iron (Example: Bariatric Advantage Multi EA) °• Chewable Calcium with Vitamin D-3 °(Example: 3 Chewable Calcium Plus 600 with Vitamin D-3) °o Take 500 mg three (3) times a day for a total of 1500 mg each day °o Do not take all 3 doses of calcium at one time as it may cause constipation, and you can only absorb 500 mg  at a time  °o Do not mix multivitamins containing iron with calcium supplements; take 2 hours apart °• Menstruating women and those with a history of anemia (a blood disease that causes weakness) may need extra iron °o Talk with your doctor to see if you need more iron °• Do not stop taking or change any vitamins or minerals until you talk to your dietitian or surgeon °• Your Dietitian and/or surgeon must approve all vitamin and mineral supplements °  °Activity and Exercise: Limit your physical activity as instructed by your doctor.  It is important to continue walking at home.  During this time, use these guidelines: °• Do not lift anything greater than ten (10) pounds for at least two (2) weeks °• Do not go back to work or drive until your surgeon says you can °• You may have sex when you feel comfortable  °o It is  VERY important for female patients to use a reliable birth control method; fertility often increases after surgery  °o All hormonal birth control will be ineffective for 30 days after surgery due to medications given during surgery a barrier method must be used. °o Do not get pregnant for at least 18 months °• Start exercising as soon as your doctor tells you that you can °o Make sure your doctor approves any physical activity °• Start with a simple walking program °• Walk 5-15 minutes each day, 7 days per week.  °• Slowly increase until you are walking 30-45 minutes per day °Consider joining our BELT program. [phone] or email belt@[employer] °  °Special Instructions Things to remember: °• Use your CPAP when sleeping if this applies to you ° °• [facility] has two free Bariatric Surgery Support Groups that meet monthly °o The 3rd Thursday of each month, 6 pm, [facility]  °o The 2nd Friday of each month, 11:45 am in the private dining room in the basement of [facility] °• It is very important to keep all follow up appointments with your surgeon, dietitian, primary care physician, and behavioral health practitioner °• Routine follow up schedule with your surgeon include appointments at 2-3 weeks, 6-8 weeks, 6 months, and 1 year at a minimum.  Your surgeon may request to see you more often.   °o After the first year, please follow up with your bariatric surgeon and dietitian at least once a year in order to maintain best weight loss results °[facility]: [phone] °[facility]: [phone] °Bariatric Nurse Coordinator: [phone] °  °   Reviewed and Endorsed  °by [facility] Patient Education Committee, June, 2016 °Edits Approved: Aug, 2018 ° ° ° °

## 2017-09-21 NOTE — Transfer of Care (Signed)
Immediate Anesthesia Transfer of Care Note  Patient: Carly Santiago  Procedure(s) Performed: Procedure(s): LAPAROSCOPIC GASTRIC SLEEVE RESECTION, Upper Endo (N/A)  Patient Location: PACU  Anesthesia Type:General  Level of Consciousness:  sedated, patient cooperative and responds to stimulation  Airway & Oxygen Therapy:Patient Spontanous Breathing and Patient connected to face mask oxgen  Post-op Assessment:  Report given to PACU RN and Post -op Vital signs reviewed and stable  Post vital signs:  Reviewed and stable  Last Vitals:  Vitals:   09/21/17 0608  BP: 136/87  Pulse: (!) 57  Resp: 18  Temp: 37.1 C  SpO2: 15%    Complications: No apparent anesthesia complications

## 2017-09-21 NOTE — H&P (Signed)
History of Present Illness Carly Kitchen T. Donatella Walski MD; 09/10/2017 12:43 PM) The patient is a 52 year old female who presents with obesity. Patient returns for her preoperative visit prior to planned laparoscopic sleeve gastrectomy. At our initial visit she had elected Roux-en-Y gastric bypass. However after she thought about things at home and did some further research she is concerned about the intestinal rerouting and more extensive nature of bypass and is requesting sleeve gastrectomy. Her original presentation was as follows:  Patient is referred by Dr Chalmers Cater for consideration for surgical treatment for morbid obesity. 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 The patient gives a history of progressive obesity since early adulthood despite multiple attempts at medical management. She has been through numerous diets and also phenteramine with some temporary success but a lot of fluctuation in her weight. Obesity has been affecting the patient in a number of ways including increasing foot and joint pain with plantar fasciitis and shortness of breath with exertion and decreased energy. However her most significant concern his diagnosis of diabetes 6 months ago which is requiring fairly large doses, over 100 units per day, insulin to control. Blood sugar has been somewhat difficult to control.. The patient has been to our initial information seminar, researched surgical options thoroughly, and now would like to proceed with sleeve gastrectomy for treatment.  She successfully completed her preoperative workup. No concerns on psychologic and nutritional evaluation. Upper GI series was unremarkable. No concerns on lab work. She generally has been feeling okay without any intercurrent illnesses or infections or other issues.    Problem List/Past Medical Carly Kitchen T. Jazzmyne Rasnick, MD; 09/10/2017 12:43 PM) IDDM (INSULIN DEPENDENT DIABETES MELLITUS) (E11.9)  PAIN, JOINT, MULTIPLE SITES (M25.50)   MORBID OBESITY (E66.01)   Past Surgical History Carly Kitchen T. Lonald Troiani, MD; 09/10/2017 12:43 PM) No pertinent past surgical history   Diagnostic Studies History Carly Kitchen T. Davie Sagona, MD; 09/10/2017 12:43 PM) Colonoscopy  >10 years ago Mammogram  never Pap Smear  1-5 years ago  Allergies Levonne Spiller, CMA; 09/10/2017 12:19 PM) No Known Drug Allergies [09/10/2017]: Allergies Reconciled   Medication History Levonne Spiller, CMA; 09/10/2017 12:20 PM) Tyler Aas FlexTouch (100UNIT/ML Soln Pen-inj, Subcutaneous) Active. Insulin Degludec (Subcutaneous) Specific strength unknown - Active. Medications Reconciled  Pregnancy / Birth History Carly Kitchen T. Rashawn Rolon, MD; 09/10/2017 12:43 PM) Age at menarche  24 years. Gravida  1 Irregular periods  Maternal age  52-20 Para  1  Other Problems Carly Kitchen T. Patsy Zaragoza, MD; 09/10/2017 12:43 PM) Cervical Cancer  Diabetes Mellitus   Vitals (Danielle Santiago CMA; 09/10/2017 12:20 PM) 09/10/2017 12:20 PM Weight: 245.5 lb Height: 67.75in Body Surface Area: 2.22 m Body Mass Index: 37.6 kg/m  Temp.: 51F(Oral)  Pulse: 78 (Regular)  BP: 118/78 (Sitting, Left Arm, Standard)       Physical Exam Carly Kitchen T. Taheem Fricke MD; 09/10/2017 12:43 PM) The physical exam findings are as follows: Note:General: Alert, obese Afro-American female, in no distress Skin: Warm and dry without rash or infection. HEENT: No palpable masses or thyromegaly. Sclera nonicteric. Pupils equal round and reactive. Lymph nodes: No cervical, supraclavicular, or inguinal nodes palpable. Lungs: Breath sounds clear and equal. No wheezing or increased work of breathing. Cardiovascular: Regular rate and rhythm without murmer. No JVD or edema. Abdomen: Nondistended. Soft and nontender. No masses palpable. No organomegaly. No palpable hernias. Extremities: No edema or joint swelling or deformity. No chronic venous stasis changes. Neurologic: Alert and fully  oriented. Gait normal. No focal weakness. Psychiatric: Normal mood and affect. Thought content  appropriate with normal judgement and insight    Assessment & Plan Carly Kitchen T. Zhana Jeangilles MD; 09/10/2017 12:46 PM) MORBID OBESITY (E66.01) Impression: Patient with progressive morbid obesity unresponsive to multiple efforts at medical management who presents with a BMI of 37.6 and comorbidities of insulin-dependent diabetes mellitus and chronic joint pain.. I believe there would be very significant medical benefit from surgical weight loss. After our discussion of surgical options currently available the patient has decided to proceed with laparoscopic sleeve gastrectomy due to the reasons above. We discussed options of Roux-en-Y gastric bypass versus sleeve and pros and cons specifically related to diabetes control at length and this is her informed decision. We have discussed the nature of the problem and the risks of remaining morbidly obese. We discussed laparoscopic sleeve gastrectomy in detail including the nature of the procedure, expected hospitalization and recovery, and major risks of anesthetic complications, bleeding, blood clots and pulmonary emboli, leakage and infection and long-term risks of stricture, , bowel obstruction, reflux, nutritional deficiencies, and failure to lose weight or weight regain. We discussed these problems could lead to death. We discussed that the procedure is not reversible. We discussed possible need for conversion to gastric bypass or other procedure. The patient was given a complete consent form to review and all questions were answered. She is completed her preoperative workup. She is given prescriptions for pain medication as well as Protonix and Zofran.

## 2017-09-21 NOTE — Op Note (Signed)
Name:  Carly Santiago MRN: [id number] Date of Surgery: 09/21/2017  Preop Diagnosis:  Morbid Obesity  Postop Diagnosis:  Morbid Obesity (Weight - 245, BMI - 37.6), S/P Gastric Sleeve resection  Procedure:  Upper endoscopy  (Intraoperative)  Surgeon:  Alphonsa Overall, M.D.  Anesthesia:  GET  Indications for procedure: Lavern L Hunt is a 52 y.o. female whose primary care physician is Jeanice Lim, MD and has completed a gastric sleeve resection today for weight loss by Dr. Excell Seltzer.  I am doing an intraoperative upper endoscopy to evaluate the gastric pouch after the sleeve gastrectomy.  Operative Note: The patient is under general anesthesia.  Dr. Excell Seltzer is laparoscoping the patient while I do an upper endoscopy to evaluate the stomach pouch.  With the patient intubated, I passed the Pentax upper endoscope without difficulty down the esophagus.  The esophagus was unremarkable.  The esophago-gastric junction was at 39 cm.    The mucosa of the stomach looked viable and the staple line was intact without bleeding.  I advanced the scope to the pylorus, but did not go through it.  While I insufflated the stomach pouch with air, Dr. Excell Seltzer  flooded the upper abdomen with saline to put the gastric pouch under saline.  There was no bubbling or evidence of a leak.  There was no evidence of narrowing of the pouch and the gastric sleeve looked tubular.  The scope was then withdrawn.  The esophagus was unremarkable and the patient tolerated the endoscopy without difficulty.  Alphonsa Overall, MD, [facility] Surgery Pager: [phone] Office phone:  [phone]

## 2017-09-22 ENCOUNTER — Encounter (HOSPITAL_COMMUNITY): Payer: Self-pay | Admitting: General Surgery

## 2017-09-22 LAB — CBC WITH DIFFERENTIAL/PLATELET
BASOS PCT: 0 %
Basophils Absolute: 0 10*3/uL (ref 0.0–0.1)
Eosinophils Absolute: 0 10*3/uL (ref 0.0–0.7)
Eosinophils Relative: 0 %
HEMATOCRIT: 34.5 % — AB (ref 36.0–46.0)
HEMOGLOBIN: 11.3 g/dL — AB (ref 12.0–15.0)
LYMPHS PCT: 11 %
Lymphs Abs: 1.6 10*3/uL (ref 0.7–4.0)
MCH: 29.5 pg (ref 26.0–34.0)
MCHC: 32.8 g/dL (ref 30.0–36.0)
MCV: 90.1 fL (ref 78.0–100.0)
MONO ABS: 1.1 10*3/uL — AB (ref 0.1–1.0)
MONOS PCT: 8 %
NEUTROS ABS: 11.1 10*3/uL — AB (ref 1.7–7.7)
NEUTROS PCT: 81 %
Platelets: 288 10*3/uL (ref 150–400)
RBC: 3.83 MIL/uL — ABNORMAL LOW (ref 3.87–5.11)
RDW: 13.9 % (ref 11.5–15.5)
WBC: 13.8 10*3/uL — ABNORMAL HIGH (ref 4.0–10.5)

## 2017-09-22 LAB — BASIC METABOLIC PANEL
ANION GAP: 9 (ref 5–15)
BUN: 8 mg/dL (ref 6–20)
CHLORIDE: 105 mmol/L (ref 101–111)
CO2: 23 mmol/L (ref 22–32)
Calcium: 8.9 mg/dL (ref 8.9–10.3)
Creatinine, Ser: 0.74 mg/dL (ref 0.44–1.00)
GFR calc non Af Amer: >60 mL/min (ref >60)
GLUCOSE: 145 mg/dL — AB (ref 65–99)
Potassium: 4.4 mmol/L (ref 3.5–5.1)
Sodium: 137 mmol/L (ref 135–145)

## 2017-09-22 LAB — GLUCOSE, CAPILLARY
GLUCOSE-CAPILLARY: 122 mg/dL — AB (ref 65–99)
Glucose-Capillary: 100 mg/dL — ABNORMAL HIGH (ref 65–99)
Glucose-Capillary: 109 mg/dL — ABNORMAL HIGH (ref 65–99)
Glucose-Capillary: 128 mg/dL — ABNORMAL HIGH (ref 65–99)
Glucose-Capillary: 134 mg/dL — ABNORMAL HIGH (ref 65–99)
Glucose-Capillary: 148 mg/dL — ABNORMAL HIGH (ref 65–99)

## 2017-09-22 NOTE — Progress Notes (Signed)
Patient alert and oriented, Post op day 1.  Provided support and encouragement.  Encouraged pulmonary toilet, ambulation and small sips of liquids.  Continues to work on clear fluids to meet 12 ounces goals.  All questions answered.  Will continue to monitor.

## 2017-09-22 NOTE — Plan of Care (Signed)
Nutrition Education Note  Received consult for diet education per DROP protocol.   Discussed 2 week post op diet with pt. Emphasized that liquids must be non carbonated, non caffeinated, and sugar free. Fluid goals discussed. Pt to follow up with outpatient bariatric RD for further diet progression after 2 weeks. Multivitamins and minerals also reviewed. Teach back method used, pt expressed understanding, expect good compliance.   Diet: First 2 Weeks  You will see the nutritionist about two (2) weeks after your surgery. The nutritionist will increase the types of foods you can eat if you are handling liquids well:  If you have severe vomiting or nausea and cannot handle clear liquids lasting longer than 1 day, call your surgeon  Protein Shake  Drink at least 2 ounces of shake 5-6 times per day  Each serving of protein shakes (usually 8 - 12 ounces) should have a minimum of:  15 grams of protein  And no more than 5 grams of carbohydrate  Goal for protein each day:  Men = 80 grams per day  Women = 60 grams per day  Protein powder may be added to fluids such as non-fat milk or Lactaid milk or Soy milk (limit to 35 grams added protein powder per serving)   Hydration  Slowly increase the amount of water and other clear liquids as tolerated (See Acceptable Fluids)  Slowly increase the amount of protein shake as tolerated  Sip fluids slowly and throughout the day  May use sugar substitutes in small amounts (no more than 6 - 8 packets per day; i.e. Splenda)   Fluid Goal  The first goal is to drink at least 8 ounces of protein shake/drink per day (or as directed by the nutritionist); some examples of protein shakes are Premier Protein, Syntrax Nectar, Adkins Advantage, EAS Edge HP, and Unjury. See handout from pre-op Bariatric Education Class:  Slowly increase the amount of protein shake you drink as tolerated  You may find it easier to slowly sip shakes throughout the day  It is important to  get your proteins in first  Your fluid goal is to drink 64 - 100 ounces of fluid daily  It may take a few weeks to build up to this  32 oz (or more) should be clear liquids  And  32 oz (or more) should be full liquids (see below for examples)  Liquids should not contain sugar, caffeine, or carbonation   Clear Liquids:  Water or Sugar-free flavored water (i.e. Fruit H2O, Propel)  Decaffeinated coffee or tea (sugar-free)  Crystal Lite, Wyler?s Lite, Minute Maid Lite  Sugar-free Jell-O  Bouillon or broth  Sugar-free Popsicle: *Less than 20 calories each; Limit 1 per day   Full Liquids:  Protein Shakes/Drinks + 2 choices per day of other full liquids  Full liquids must be:  No More Than 12 grams of Carbs per serving  No More Than 3 grams of Fat per serving  Strained low-fat cream soup  Non-Fat milk  Fat-free Lactaid Milk  Sugar-free yogurt (Dannon Lite & Fit, Greek yogurt, Oikos Zero)   Haeli Gerlich, MS, RD, LDN [facility] Clinical Dietitian Pager: [phone] After Hours Pager: [phone]   

## 2017-09-22 NOTE — Consult Note (Signed)
   [facility] CM Inpatient Consult   09/22/2017  Jaselynn ERINE PHENIX [birth year] [phone]   Came to visit Ms. Johnnye Sima on behalf of [place] [employer] services for [facility] employees/dependents with [facility] insurance. Discussed that she will receive post hospital discharge call.   Provided 24-hr nurse advice line magnet.   Appreciative of the visit.   Marthenia Rolling, MSN-Ed, RN,BSN [facility] Liaison [phone]

## 2017-09-22 NOTE — Progress Notes (Signed)
Patient ID: Carly Santiago, female   DOB: 08/03/1965, 52 y.o.   MRN: [id number] 1 Day Post-Op   Subjective: Some nausea and some epigastric pain, both controlled with medications.  Up walking in the halls.  Tolerating water and protein shakes but not a lot of volume yet.  Objective: Vital signs in last 24 hours: Temp:  [97.7 F (36.5 C)-99.2 F (37.3 C)] 98.5 F (36.9 C) (05/14 1012) Pulse Rate:  [51-71] 51 (05/14 1012) Resp:  [11-19] 18 (05/14 1012) BP: (98-132)/(58-79) 117/79 (05/14 1012) SpO2:  [92 %-98 %] 97 % (05/14 1012) Last BM Date: 09/20/17  Intake/Output from previous day: 05/13 0701 - 05/14 0700 In: 3453.3 [P.O.:380; I.V.:2973.3; IV Piggyback:100] Out: 1710 [Urine:1700; Blood:10] Intake/Output this shift: No intake/output data recorded.  General appearance: alert, cooperative and no distress GI: Mild to moderate epigastric tenderness Incision/Wound: Clean and dry  Lab Results:  Recent Labs    09/21/17 0630 09/21/17 1400 09/22/17 0439  WBC 6.4  --  13.8*  HGB 12.6 12.1 11.3*  HCT 37.6 36.4 34.5*  PLT 300  --  288   BMET Recent Labs    09/21/17 0630 09/22/17 0439  NA 138 137  K 3.8 4.4  CL 104 105  CO2 23 23  GLUCOSE 182* 145*  BUN 8 8  CREATININE 0.81 0.74  CALCIUM 9.1 8.9   CBG (last 3)  Recent Labs    09/22/17 0026 09/22/17 0446 09/22/17 0839  GLUCAP 148* 128* 122*     Studies/Results: No results found.  Anti-infectives: Anti-infectives (From admission, onward)   Start     Dose/Rate Route Frequency Ordered Stop   09/21/17 0600  cefoTEtan in Dextrose 5% (CEFOTAN) IVPB 2 g     2 g Intravenous On call to O.R. 09/21/17 0557 09/21/17 0733      Assessment/Plan: s/p Procedure(s): LAPAROSCOPIC GASTRIC SLEEVE RESECTION, Upper Endo Doing pretty well with no apparent complication.  Still with some discomfort and nausea.  Plan to observe 24 hours and repeat CBC tomorrow.  Expect discharge tomorrow.   LOS: 1 day    Edward Jolly 09/22/2017

## 2017-09-22 NOTE — Anesthesia Postprocedure Evaluation (Signed)
Anesthesia Post Note  Patient: Carly Santiago  Procedure(s) Performed: LAPAROSCOPIC GASTRIC SLEEVE RESECTION, Upper Endo (N/A Abdomen)     Patient location during evaluation: Nursing Unit Anesthesia Type: General Level of consciousness: awake and alert, oriented and patient cooperative Pain management: pain level controlled Vital Signs Assessment: post-procedure vital signs reviewed and stable Respiratory status: spontaneous breathing, nonlabored ventilation and respiratory function stable Cardiovascular status: blood pressure returned to baseline and stable Postop Assessment: able to ambulate (nausea improving) Anesthetic complications: no    Last Vitals:  Vitals:   09/22/17 0629 09/22/17 1012  BP: (!) 112/58 117/79  Pulse: (!) 58 (!) 51  Resp: 17 18  Temp: 37.3 C 36.9 C  SpO2: 97% 97%    Last Pain:  Vitals:   09/22/17 1012  TempSrc: Oral  PainSc:                  Mikyla Schachter,E. Lianah Peed

## 2017-09-23 LAB — CBC WITH DIFFERENTIAL/PLATELET
BASOS ABS: 0 10*3/uL (ref 0.0–0.1)
BASOS PCT: 1 %
EOS ABS: 0.1 10*3/uL (ref 0.0–0.7)
EOS PCT: 1 %
HCT: 32.8 % — ABNORMAL LOW (ref 36.0–46.0)
Hemoglobin: 10.8 g/dL — ABNORMAL LOW (ref 12.0–15.0)
Lymphocytes Relative: 31 %
Lymphs Abs: 2.6 10*3/uL (ref 0.7–4.0)
MCH: 29.8 pg (ref 26.0–34.0)
MCHC: 32.9 g/dL (ref 30.0–36.0)
MCV: 90.6 fL (ref 78.0–100.0)
MONO ABS: 0.7 10*3/uL (ref 0.1–1.0)
Monocytes Relative: 9 %
NEUTROS ABS: 5 10*3/uL (ref 1.7–7.7)
Neutrophils Relative %: 58 %
PLATELETS: 276 10*3/uL (ref 150–400)
RBC: 3.62 MIL/uL — ABNORMAL LOW (ref 3.87–5.11)
RDW: 14.2 % (ref 11.5–15.5)
WBC: 8.5 10*3/uL (ref 4.0–10.5)

## 2017-09-23 LAB — GLUCOSE, CAPILLARY
GLUCOSE-CAPILLARY: 110 mg/dL — AB (ref 65–99)
GLUCOSE-CAPILLARY: 89 mg/dL (ref 65–99)
GLUCOSE-CAPILLARY: 99 mg/dL (ref 65–99)

## 2017-09-23 MED ORDER — TRESIBA FLEXTOUCH 100 UNIT/ML ~~LOC~~ SOPN
15.0000 [IU] | PEN_INJECTOR | Freq: Every day | SUBCUTANEOUS | Status: DC
Start: 2017-09-23 — End: 2023-04-06

## 2017-09-23 MED ORDER — OXYCODONE HCL 5 MG/5ML PO SOLN: 5.0000 mg | ORAL | 0 refills | Status: DC | PRN

## 2017-09-23 NOTE — Progress Notes (Signed)
Patient alert and oriented, pain is controlled. Patient is tolerating fluids, advanced to protein shake today, patient is tolerating well.  Reviewed Gastric sleeve discharge instructions with patient and patient is able to articulate understanding.  Provided information on BELT program, Support Group and [facility] outpatient pharmacy. All questions answered, will continue to monitor.  Total 24 hour fluid recall 750 Per dehydration protocol call back one week post op

## 2017-09-23 NOTE — Progress Notes (Signed)
Discharge instructions reviewed with patient. All questions answered. Patient ambulated to vehicle with belongings by nurse tech 

## 2017-09-23 NOTE — Discharge Summary (Signed)
  Patient ID: Carly Santiago [id number] y.o. April 16, 1966  09/21/2017  Discharge date and time: 09/23/2017   Admitting Physician: Edward Jolly  Discharge Physician: Edward Jolly  Admission Diagnoses: Morbid Obesity, DM II  Discharge Diagnoses: Same  Operations: Procedure(s): LAPAROSCOPIC GASTRIC SLEEVE RESECTION, Upper Endo  Admission Condition: good  Discharged Condition: good  Indication for Admission: Patient with progressive morbid obesity unresponsive to multiple efforts at medical management who presents with a BMI of 37.6 and comorbidities of insulin-dependent diabetes mellitus and chronic joint pain.  After extensive preoperative work-up and discussion detailed elsewhere she has elected to proceed with laparoscopic sleeve gastrectomy for treatment of her morbid obesity.    Hospital Course: On the morning of admission the patient underwent an uneventful laparoscopic sleeve gastrectomy.  Her postoperative course was uncomplicated.  On the first postoperative morning she had some moderate epigastric pain and also some nausea and was not felt quite ready for discharge.  Had mild leukocytosis.  Over the next 24 hours her pain and nausea essentially resolved.  She is tolerating fluids well.  Leukocytosis resolved.  Abdomen is benign.  Incisions clean and dry.  She is felt ready for discharge.   Disposition: Home  Patient Instructions:  Allergies as of 09/23/2017   No Known Allergies     Medication List    STOP taking these medications   meloxicam 15 MG tablet Commonly known as:  MOBIC     TAKE these medications   BIOTIN PO Take by mouth.   FIASP FLEXTOUCH 100 UNIT/ML Sopn Generic drug:  Insulin Aspart (w/Niacinamide) Inject 5-7 Units into the skin 3 (three) times daily with meals. Notes to patient:  Monitor Blood Sugar Frequently and keep a log for primary care physician, you may need to adjust medication dosage with rapid weight loss.      multivitamin capsule Take 1 capsule by mouth daily.   ONETOUCH DELICA LANCETS 14G Misc   ONETOUCH VERIO test strip Generic drug:  glucose blood   oxyCODONE 5 MG/5ML solution Commonly known as:  ROXICODONE Take 5-10 mLs (5-10 mg total) by mouth every 4 (four) hours as needed for moderate pain or severe pain.   phentermine 37.5 MG capsule Take 37.5 mg by mouth every morning.   TRESIBA FLEXTOUCH 100 UNIT/ML Sopn FlexTouch Pen Generic drug:  insulin degludec Inject 0.15 mLs (15 Units total) into the skin at bedtime. What changed:  how much to take       Activity: activity as tolerated Diet: Bariatric protein shakes Wound Care: none needed  Follow-up:  With Dr. Excell Seltzer in 3 weeks.  Signed: Edward Jolly MD, FACS  09/23/2017, 8:50 AM

## 2017-09-23 NOTE — Progress Notes (Signed)
Inpatient Diabetes Program Recommendations  AACE/ADA: New Consensus Statement on Inpatient Glycemic Control (2015)  Target Ranges:  Prepandial:   less than 140 mg/dL      Peak postprandial:   less than 180 mg/dL (1-2 hours)      Critically ill patients:  140 - 180 mg/dL   Lab Results  Component Value Date   GLUCAP 99 09/23/2017   HGBA1C 7.4 (H) 09/08/2017   LATE ENTRY Review of Glycemic Control  Diabetes history: DM2 Outpatient Diabetes medications: Tresiba 45 units QHS, FIASP 5-7 units tidwc Current orders for Inpatient glycemic control: Lantus 15 units QHS, Novolog 0-15 units Q4H  HgbA1C - 7.4% - fairly good control.  Inpatient Diabetes Program Recommendations:     For home - Lantus 15 units QHS Novolog 0-20 units Q4H  Check blood sugars 4-6x/day and call Endo with results.  Thank you. Lorenda Peck, RD, LDN, CDE Inpatient Diabetes Coordinator [phone]

## 2017-09-28 ENCOUNTER — Telehealth (HOSPITAL_COMMUNITY): Payer: Self-pay

## 2017-09-28 NOTE — Telephone Encounter (Signed)
Patient called to discuss post bariatric surgery follow up questions.  See below:   1.  Tell me about your pain and pain management?has not had pain requiring pain medication in several days.  Sore at times  2.  Let's talk about fluid intake.  How much total fluid are you taking in?60-64 ounces of fluid  3.  How much protein have you taken in the last 2 days?45-50 grams of protein  4.  Have you had nausea?  Tell me about when have experienced nausea and what you did to help?no nausea has not needed to take any medicaiton  5.  Has the frequency or color changed with your urine?urinating frequently clear color  6.  Tell me what your incisions look like?no problems with incision, glue intake  7.  Have you been passing gas? BM?passing small amt of gas no bm, Instructed to purchase MOM or Miralax and take today.  Call office if no results.  Patient no pain or N/V  8.  If a problem or question were to arise who would you call?  Do you know contact numbers for [place], [facility], and [facility]?patient aware of how to call all services  9.  How has the walking going?walking hourly going to [employer] to get more protein shakes while we were talking on phone  10.  How are your vitamins and calcium going?  How are you taking them?taking vitamins and dalcium, some difficulty with calcium.  Patient to check packages when arrives home some concern over if purchased correct products.  Asked patient to send photo if there are continued questions or concerns

## 2017-09-29 DIAGNOSIS — E1165 Type 2 diabetes mellitus with hyperglycemia: Secondary | ICD-10-CM | POA: Diagnosis not present

## 2017-10-06 ENCOUNTER — Encounter: Payer: 59 | Attending: General Surgery | Admitting: Registered"

## 2017-10-06 DIAGNOSIS — Z713 Dietary counseling and surveillance: Secondary | ICD-10-CM | POA: Diagnosis not present

## 2017-10-06 DIAGNOSIS — E119 Type 2 diabetes mellitus without complications: Secondary | ICD-10-CM

## 2017-10-06 DIAGNOSIS — Z6837 Body mass index (BMI) 37.0-37.9, adult: Secondary | ICD-10-CM | POA: Insufficient documentation

## 2017-10-07 NOTE — Progress Notes (Signed)
Bariatric Class:  Appt start time: 1530 end time:  1630.  2 Week Post-Operative Nutrition Class  Patient was seen on 10/06/2017 for Post[facility]ative [facility]   Surgery date: 09/21/2017 Surgery type: RYGB Start weight at [facility]: 245.7 Weight today: 232.2 Weight change: 13.5  Pt states she is consuming at least 64 ounces of fluid and 60 grams of protein a day. Pt states she is checking BS 3x/day (averaging 100). Pt does not report any complications or concerns.   TANITA  BODY COMP RESULTS  10/06/2017   BMI (kg/m^2) 35.3   Fat Mass (lbs) 109.4   Fat Free Mass (lbs) 122.8   Total Body Water (lbs) 89.4   The following the learning objectives were met by the patient during this course:  Identifies Phase 3A (Soft, High Proteins) Dietary Goals and will begin from 2 weeks post-operatively to 2 months post-operatively  Identifies appropriate sources of fluids and proteins   States protein recommendations and appropriate sources post-operatively  Identifies the need for appropriate texture modifications, mastication, and bite sizes when consuming solids  Identifies appropriate multivitamin and calcium sources post-operatively  Describes the need for physical activity post-operatively and will follow MD recommendations  States when to call healthcare provider regarding medication questions or post-operative complications  Handouts given during class include:  Phase 3A: Soft, High Protein Diet Handout  Follow-Up Plan: Patient will follow-up at [facility] in 6 weeks for 2 month post-op nutrition visit for diet advancement per MD.

## 2017-10-09 ENCOUNTER — Telehealth: Payer: Self-pay | Admitting: Registered"

## 2017-10-09 NOTE — Telephone Encounter (Signed)
RD called pt to verify fluid intake once starting soft, solid proteins 2 week post-bariatric surgery.   Daily Fluid intake: 64+ ounces Daily Protein intake: ~26 grams   Concerns/issues: Pt states she is not tracking food and drink intake. Pt states she is so tired of drinking protein shakes. Pt states she is not hungry throughout the day and eats when she is hungry; skipping lunch. Pt states she had meatloaf and mashed potatoes for dinner last night. Pt states she felt pain in the back of her leg last night. Pt was encouraged tol contact her doctor and/or bariatric nurse coordinator.   RD encouraged pt to track protein intake, eat every 3-5 hours, and offered suggestions of protein foods to eat. Pt was also encouraged to aim for protein only items rather than adding mashed potatoes into her routine.

## 2017-10-12 ENCOUNTER — Other Ambulatory Visit (HOSPITAL_COMMUNITY): Payer: Self-pay | Admitting: General Surgery

## 2017-10-12 DIAGNOSIS — R52 Pain, unspecified: Secondary | ICD-10-CM

## 2017-10-13 ENCOUNTER — Other Ambulatory Visit: Payer: Self-pay | Admitting: *Deleted

## 2017-10-13 ENCOUNTER — Ambulatory Visit (HOSPITAL_COMMUNITY)
Admission: RE | Admit: 2017-10-13 | Discharge: 2017-10-13 | Disposition: A | Discharge status: Home / Self Care | Payer: 59 | Source: Ambulatory Visit | Attending: General Surgery | Admitting: General Surgery

## 2017-10-13 DIAGNOSIS — M79606 Pain in leg, unspecified: Secondary | ICD-10-CM | POA: Insufficient documentation

## 2017-10-13 DIAGNOSIS — R52 Pain, unspecified: Secondary | ICD-10-CM

## 2017-10-13 NOTE — Patient Outreach (Signed)
[facility]) Care Management  10/13/2017  Carly Santiago January 09, 1966 [id number]   Subjective: Telephone call to patient's home / mobile number, spoke with patient, and HIPAA verified.  Discussed [facility] Care Management [facility]MR Transition of care follow up, patient voiced understanding, and is in agreement to follow up.    Patient states she is doing better, has transitioned from full liquid to soft diet without difficulty, surgery went well, having problems with tingling bilateral leg pain being evaluated by surgeon, ultrasound negative, and no DVT.  States the surgeon has instructed her to walk more and the pain should decrease. Patient aware of signs / symptoms to report to MD, she voices understanding, and states she will contact MD again if issue does not resolve.  States she has a hospital follow up appointment with surgeon on 10/16/17. States she will also discuss her concern with lack of assistance after hours from surgeons office during follow up appointment. Discussed importance of hospital follow up with primary MD, patient voices understanding, and states she will follow up as appropriate.   States she has not been sick in the past, does not have a primary, verbalized understanding of primary MD role, and would like this RNCM to send her the email [employer] employees received regarding assistance with obtaining primary care provider.    States she also has a history of bilateral achilles tendon ruptures and is planning to pursue treatment after she recovers from her recent surgery.  Patient voices understanding of medical diagnosis, surgery, and treatment plan.  Patient states she is able to manage self care and has assistance as needed with activities of daily living / home management.   States she is accessing the following [facility] benefits: outpatient pharmacy, hospital indemnity (verbally given contact number for Teena Dunk [phone], will file claim if appropriate, verbally given contact number  for [employer] [phone] to request itemized bill), and will call [employer] today to start family medical leave act (FMLA) process.  Patient is aware that she will need to obtain full copy of paperwork and follow up as needed to ensure paperwork is sent to [employer] in a timely fashion.  Patient states she does not have any education material, transition of care, care coordination, transportation, community resource, or pharmacy needs at this time.  States she is in agreement to a referral to [facility] for diabetes management, verbally given number to [place] [phone]), patient voices understanding, and states she will also call [employer] regarding referral.  Discussed diabetes disease management resources for [facility] employees and advised RNCM will send web sites in the successful outreach letter.   States she is very appreciative of the follow up and is in agreement to receive [facility] information.    Objective: Per KPN (Knowledge Performance Now, point of care tool) and chart review, patient hospitalized 09/21/17 -09/23/17 for morbid obesity, and status post LAPAROSCOPIC Gastric Sleeve resection, Upper endoscopy  (Intraoperative) on 09/21/17.  Patient also has a history of diabetes.   Assessment:  Received [facility] Preoperative / Transition of care referral on 09/07/17.  Transition of care follow up completed, no care management needs, and will proceed with case closure.     Plan: RNCM will send patient successful outreach letter, [facility] pamphlet, and magnet. RNCM will complete case closure due to follow up completed / no care management needs.  RNCM will send patient secure email regarding [employer] employees receiving assistance with obtaining primary care provider (email dated 06/23/17 from [email or web address]).  Morine Kohlman H. Annia Friendly, BSN, [place] [facility] Telephonic CM Phone: [phone] Fax: [phone]

## 2017-10-13 NOTE — Progress Notes (Signed)
Preliminary notes--Bilateral lower extremities venous duplex exam completed. Negative for DVT. Sluggish flow noted in distal femoral and popliteal veins bilaterally. Result spoke with Dr. Lear Ng office RN Sunday Spillers. Patient was instructed to follow up regular office visit.   Carly Santiago (RDMS RVT) 10/13/17 12:10 PM

## 2017-11-18 ENCOUNTER — Ambulatory Visit: Payer: Self-pay | Admitting: Skilled Nursing Facility1

## 2018-10-21 IMAGING — RF DG UGI W/ KUB
11 of 17 series · 13 of 24 positions shown · non-contrast
Comparison: Chest radiographs today reported separately.

CLINICAL DATA: 51-year-old female undergoing operative planning for
gastric sleeve surgery.

EXAM:
UPPER GI SERIES WITH KUB
TECHNIQUE: After obtaining a scout radiograph a routine upper GI series was
performed using barium
FLUOROSCOPY TIME:  Fluoroscopy Time:  2 minutes 30 seconds
Radiation Exposure Index (if provided by the fluoroscopic device):
48.7 mGy
Number of Acquired Spot Images: 0

[Series 1: t abdomen supine · 0.15mm/px · 1 of 1 slices shown]
[im 1/1]
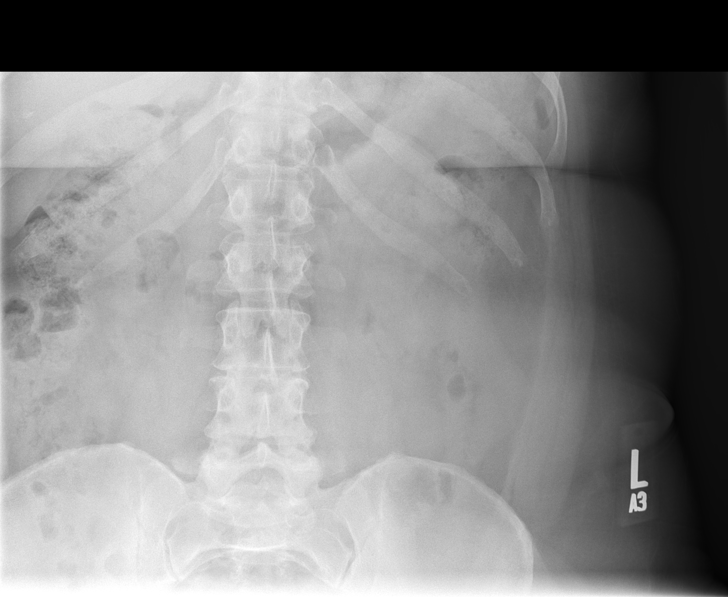

[Series 2: cp_standard · 0.52mm/px · 1 of 12 frames shown (1 of 10)]
[frame 11/12]
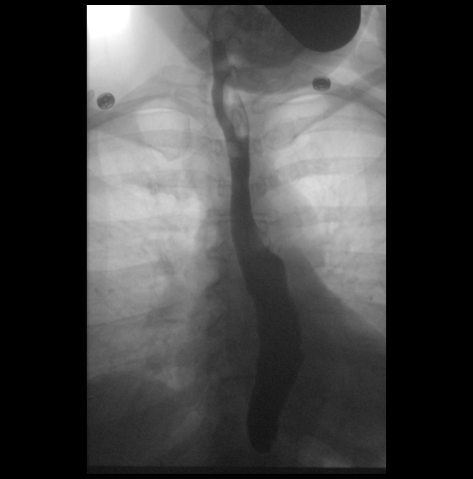

[Series 3: cp_standard · 0.52mm/px · 1 of 13 frames shown (2 of 10)]
[frame 7/13]
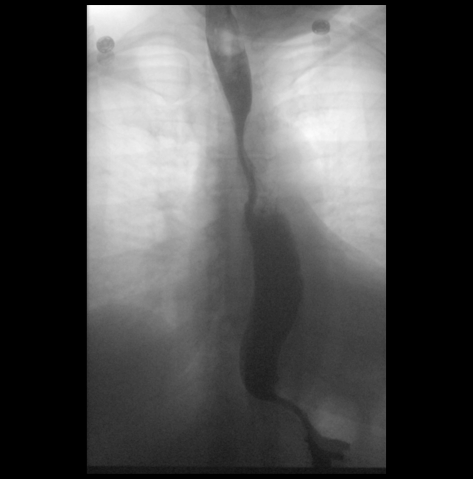

[Series 5: cp_standard · 0.35mm/px · 2 of 21 frames shown (3 of 10)]
[frame 4/21]
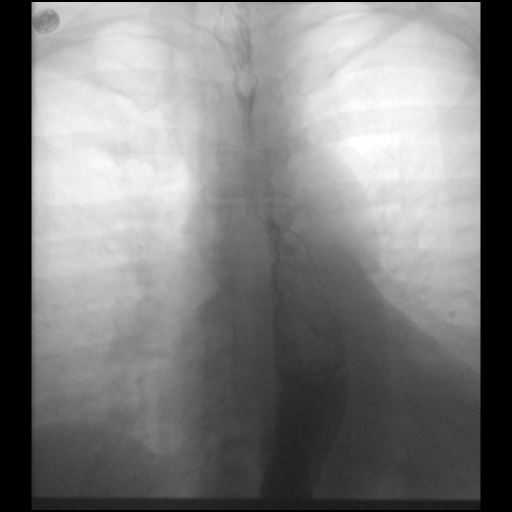
[frame 18/21]
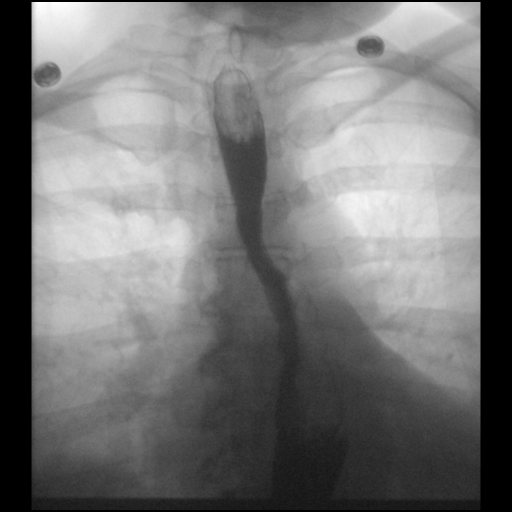

[Series 8: cp_standard · 0.17mm/px · 1 of 1 slices shown (4 of 10)]
[im 1/1]
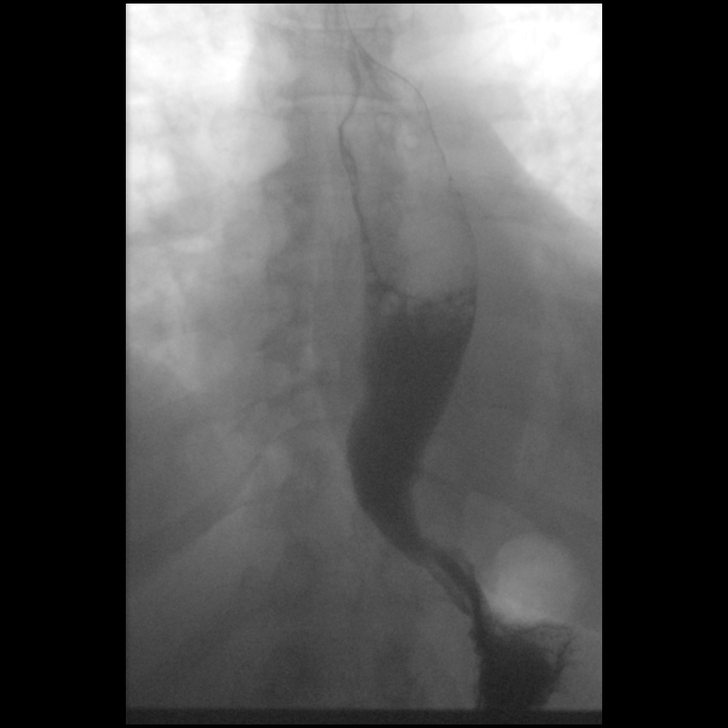

[Series 11: cp_standard · 0.17mm/px · 1 of 1 slices shown (5 of 10)]
[im 1/1]
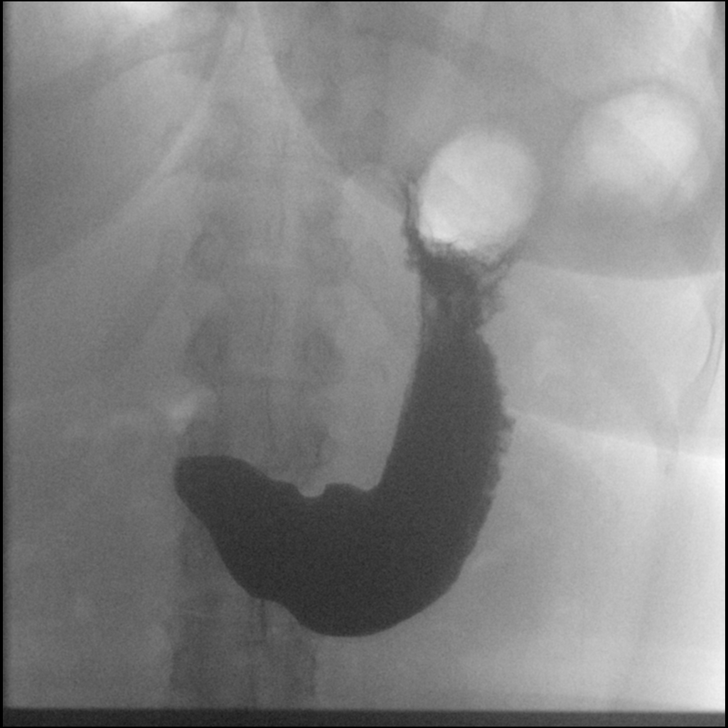

[Series 12: cp_standard · 0.17mm/px · 1 of 1 slices shown (6 of 10)]
[im 1/1]
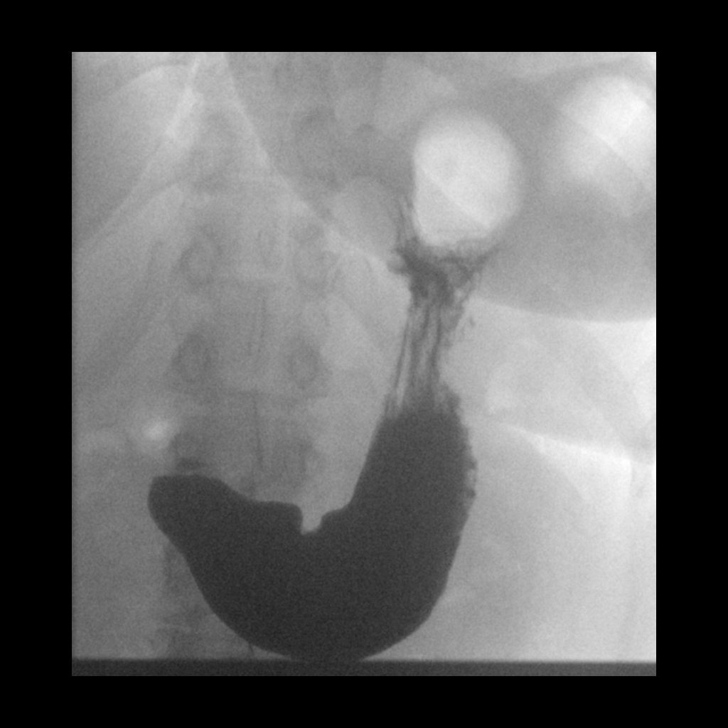

[Series 15: cp_standard · 0.18mm/px · 1 of 1 slices shown (7 of 10)]
[im 1/1]
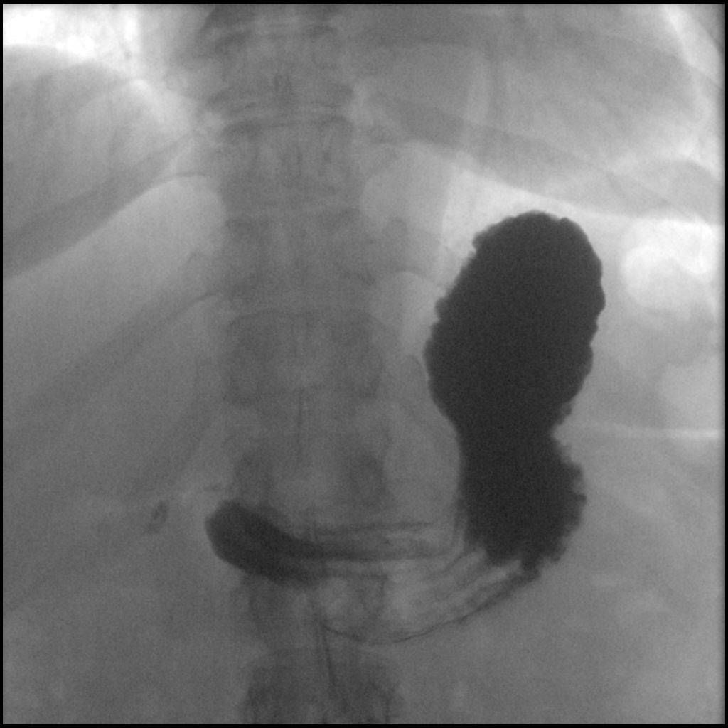

[Series 18: cp_standard · 0.36mm/px · 2 of 24 frames shown (8 of 10)]
[frame 4/24]
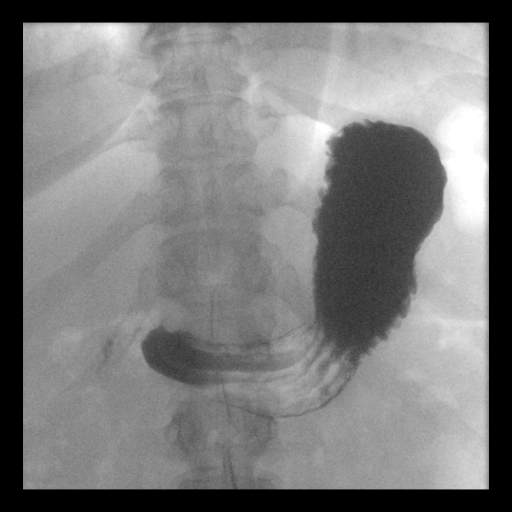
[frame 21/24]
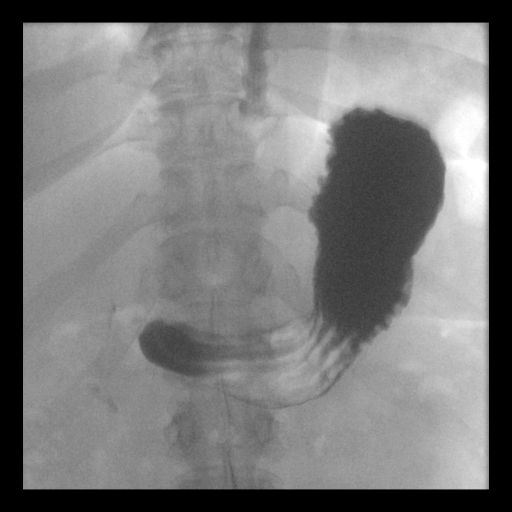

[Series 21: cp_standard · 0.18mm/px · 1 of 1 slices shown (9 of 10)]
[im 1/1]
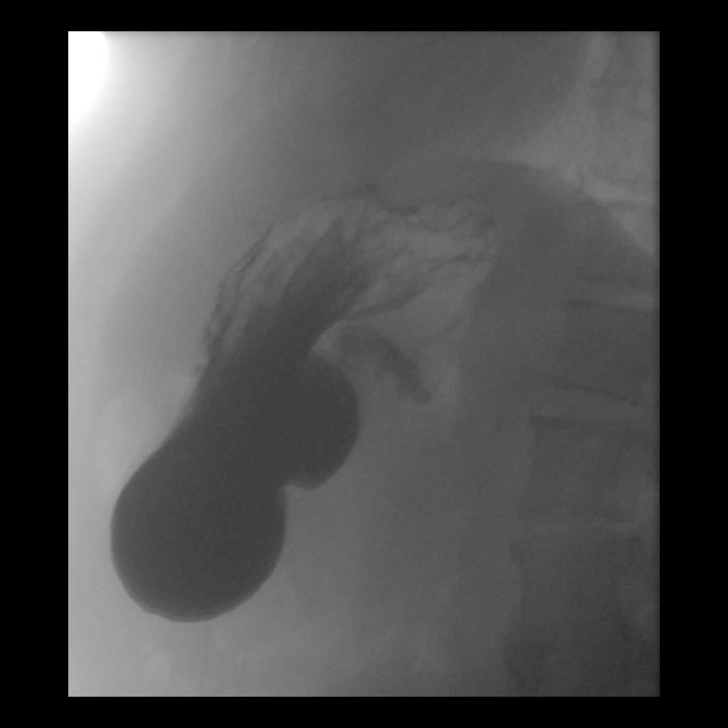

[Series 24: cp_standard · 0.19mm/px · 1 of 1 slices shown (10 of 10)]
[im 1/1]
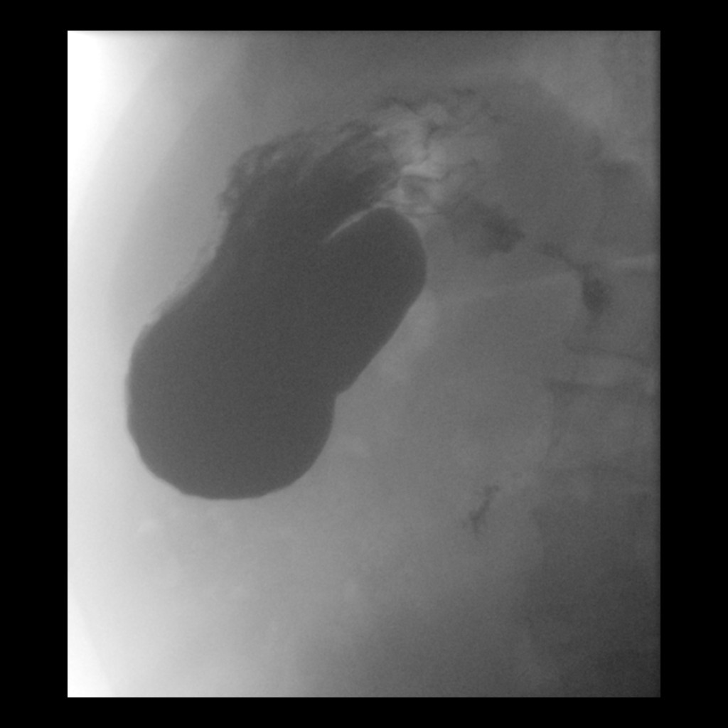

[13 of 24 positions shown; findings below may reference images not displayed]

FINDINGS: Supine AP scout view of the abdomen. Non obstructed bowel gas
pattern. Abdominal visceral contours appear normal. No osseous
abnormality identified.

A single contrast study was undertaken and the patient tolerated
this well and without difficulty.

No obstruction to the forward flow of contrast throughout the
esophagus and into the stomach. Normal esophageal course and
contour. Occasional esophageal tertiary contractions occurred
(series 5 and series 6). Esophageal mucosal pattern appears normal.

Normal gastroesophageal junction. Normal gastric contour. Visible
gastric mucosal pattern appears normal. The gastric antrum and
duodenal bulb appear normal. Time to gastric emptying into the
duodenum is within normal limits.

The duodenum C-loop is normally configured. The ligament of Treitz
is normally located. The initial loop of jejunum however courses
back across midline to the right. This is all demonstrated on cine
images series 31.

After crossing to the right, the proximal jejunum loop then transit
back across into the left abdomen as seen on the final image series
37.

No gastroesophageal reflux occurred spontaneously or was elicited.
IMPRESSION: 1. Essentially normal upper GI series.
Normal duodenum C-loop and normal position of the ligament of
Treitz. The proximal jejunum loop tracks from the left abdomen
across midline to the right abdomen, and then back to the left.
2. Mild presbyesophagus with occasional esophageal tertiary
contractions. No gastroesophageal reflux occurred.

## 2019-03-04 ENCOUNTER — Encounter (HOSPITAL_COMMUNITY): Payer: Self-pay

## 2019-04-21 DIAGNOSIS — Z01419 Encounter for gynecological examination (general) (routine) without abnormal findings: Secondary | ICD-10-CM | POA: Diagnosis not present

## 2019-04-21 DIAGNOSIS — Z1231 Encounter for screening mammogram for malignant neoplasm of breast: Secondary | ICD-10-CM | POA: Diagnosis not present

## 2019-04-21 DIAGNOSIS — Z1151 Encounter for screening for human papillomavirus (HPV): Secondary | ICD-10-CM | POA: Diagnosis not present

## 2019-04-21 DIAGNOSIS — Z6835 Body mass index (BMI) 35.0-35.9, adult: Secondary | ICD-10-CM | POA: Diagnosis not present

## 2019-04-25 DIAGNOSIS — E119 Type 2 diabetes mellitus without complications: Secondary | ICD-10-CM | POA: Diagnosis not present

## 2019-04-25 DIAGNOSIS — F439 Reaction to severe stress, unspecified: Secondary | ICD-10-CM | POA: Diagnosis not present

## 2019-04-25 DIAGNOSIS — Z9884 Bariatric surgery status: Secondary | ICD-10-CM | POA: Diagnosis not present

## 2019-05-19 DIAGNOSIS — Z9884 Bariatric surgery status: Secondary | ICD-10-CM | POA: Diagnosis not present

## 2019-05-19 DIAGNOSIS — E1165 Type 2 diabetes mellitus with hyperglycemia: Secondary | ICD-10-CM | POA: Diagnosis not present

## 2019-05-19 DIAGNOSIS — E669 Obesity, unspecified: Secondary | ICD-10-CM | POA: Diagnosis not present

## 2019-05-31 DIAGNOSIS — A599 Trichomoniasis, unspecified: Secondary | ICD-10-CM | POA: Diagnosis not present

## 2019-05-31 DIAGNOSIS — R232 Flushing: Secondary | ICD-10-CM | POA: Diagnosis not present

## 2019-05-31 DIAGNOSIS — R634 Abnormal weight loss: Secondary | ICD-10-CM | POA: Diagnosis not present

## 2019-05-31 DIAGNOSIS — N926 Irregular menstruation, unspecified: Secondary | ICD-10-CM | POA: Diagnosis not present

## 2019-05-31 DIAGNOSIS — R87610 Atypical squamous cells of undetermined significance on cytologic smear of cervix (ASC-US): Secondary | ICD-10-CM | POA: Diagnosis not present

## 2019-10-06 ENCOUNTER — Other Ambulatory Visit: Payer: Self-pay

## 2019-10-06 ENCOUNTER — Ambulatory Visit: Payer: Self-pay

## 2019-10-06 ENCOUNTER — Other Ambulatory Visit: Payer: Self-pay | Admitting: Family Medicine

## 2019-10-06 DIAGNOSIS — M25561 Pain in right knee: Secondary | ICD-10-CM

## 2019-10-15 DIAGNOSIS — H524 Presbyopia: Secondary | ICD-10-CM | POA: Diagnosis not present

## 2019-11-15 DIAGNOSIS — H25041 Posterior subcapsular polar age-related cataract, right eye: Secondary | ICD-10-CM | POA: Diagnosis not present

## 2019-11-15 DIAGNOSIS — Z01818 Encounter for other preprocedural examination: Secondary | ICD-10-CM | POA: Diagnosis not present

## 2019-12-07 DIAGNOSIS — H25041 Posterior subcapsular polar age-related cataract, right eye: Secondary | ICD-10-CM | POA: Diagnosis not present

## 2019-12-07 DIAGNOSIS — H2511 Age-related nuclear cataract, right eye: Secondary | ICD-10-CM | POA: Diagnosis not present

## 2019-12-07 DIAGNOSIS — H25811 Combined forms of age-related cataract, right eye: Secondary | ICD-10-CM | POA: Diagnosis not present

## 2020-01-10 DIAGNOSIS — H00021 Hordeolum internum right upper eyelid: Secondary | ICD-10-CM | POA: Diagnosis not present

## 2020-04-04 ENCOUNTER — Ambulatory Visit (HOSPITAL_COMMUNITY)
Admission: EM | Admit: 2020-04-04 | Discharge: 2020-04-04 | Disposition: A | Discharge status: Still In Hospital | Payer: 59

## 2020-04-04 ENCOUNTER — Emergency Department (HOSPITAL_COMMUNITY): Payer: 59

## 2020-04-04 ENCOUNTER — Encounter (HOSPITAL_COMMUNITY): Payer: Self-pay | Admitting: Pediatrics

## 2020-04-04 ENCOUNTER — Emergency Department (HOSPITAL_COMMUNITY)
Admission: EM | Admit: 2020-04-04 | Discharge: 2020-04-04 | Disposition: A | Discharge status: Home / Self Care | Payer: 59 | Attending: Emergency Medicine | Admitting: Emergency Medicine

## 2020-04-04 ENCOUNTER — Other Ambulatory Visit: Payer: Self-pay

## 2020-04-04 DIAGNOSIS — R079 Chest pain, unspecified: Secondary | ICD-10-CM | POA: Diagnosis not present

## 2020-04-04 DIAGNOSIS — M25512 Pain in left shoulder: Secondary | ICD-10-CM | POA: Insufficient documentation

## 2020-04-04 DIAGNOSIS — I517 Cardiomegaly: Secondary | ICD-10-CM | POA: Diagnosis not present

## 2020-04-04 DIAGNOSIS — M25511 Pain in right shoulder: Secondary | ICD-10-CM | POA: Diagnosis not present

## 2020-04-04 DIAGNOSIS — E119 Type 2 diabetes mellitus without complications: Secondary | ICD-10-CM | POA: Insufficient documentation

## 2020-04-04 DIAGNOSIS — Z794 Long term (current) use of insulin: Secondary | ICD-10-CM | POA: Insufficient documentation

## 2020-04-04 LAB — CBC
HCT: 40.1 % (ref 36.0–46.0)
Hemoglobin: 13 g/dL (ref 12.0–15.0)
MCH: 30 pg (ref 26.0–34.0)
MCHC: 32.4 g/dL (ref 30.0–36.0)
MCV: 92.4 fL (ref 80.0–100.0)
Platelets: 332 10*3/uL (ref 150–400)
RBC: 4.34 MIL/uL (ref 3.87–5.11)
RDW: 14 % (ref 11.5–15.5)
WBC: 7.8 10*3/uL (ref 4.0–10.5)
nRBC: 0 % (ref 0.0–0.2)

## 2020-04-04 LAB — BASIC METABOLIC PANEL
Anion gap: 10 (ref 5–15)
BUN: 5 mg/dL — ABNORMAL LOW (ref 6–20)
CO2: 27 mmol/L (ref 22–32)
Calcium: 9.2 mg/dL (ref 8.9–10.3)
Chloride: 102 mmol/L (ref 98–111)
Creatinine, Ser: 0.76 mg/dL (ref 0.44–1.00)
GFR, Estimated: >60 mL/min (ref >60)
Glucose, Bld: 131 mg/dL — ABNORMAL HIGH (ref 70–99)
Potassium: 3.6 mmol/L (ref 3.5–5.1)
Sodium: 139 mmol/L (ref 135–145)

## 2020-04-04 LAB — I-STAT BETA HCG BLOOD, ED (MC, WL, AP ONLY): I-stat hCG, quantitative: <5 m[IU]/mL (ref <5)

## 2020-04-04 LAB — TROPONIN I (HIGH SENSITIVITY): Troponin I (High Sensitivity): 2 ng/L (ref <18)

## 2020-04-04 MED ORDER — NAPROXEN 500 MG PO TABS: 500.0000 mg | ORAL_TABLET | Freq: Two times a day (BID) | ORAL | 0 refills | Status: DC

## 2020-04-04 MED ORDER — HYDROCODONE-ACETAMINOPHEN 5-325 MG PO TABS
1.0000 | ORAL_TABLET | Freq: Four times a day (QID) | ORAL | 0 refills | Status: DC | PRN
Start: 2020-04-04 — End: 2023-04-06

## 2020-04-04 MED ORDER — HYDROCODONE-ACETAMINOPHEN 5-325 MG PO TABS
2.0000 | ORAL_TABLET | Freq: Once | ORAL | Status: AC
  Administered 2020-04-04: 2 via ORAL
  Filled 2020-04-04: qty 2

## 2020-04-04 MED ORDER — HYDROCODONE-ACETAMINOPHEN 5-325 MG PO TABS
1.0000 | ORAL_TABLET | Freq: Four times a day (QID) | ORAL | 0 refills | Status: DC | PRN
Start: 2020-04-04 — End: 2020-04-04

## 2020-04-04 MED ORDER — KETOROLAC TROMETHAMINE 60 MG/2ML IM SOLN
60.0000 mg | Freq: Once | INTRAMUSCULAR | Status: AC
  Administered 2020-04-04: 60 mg via INTRAMUSCULAR
  Filled 2020-04-04: qty 2

## 2020-04-04 NOTE — Discharge Instructions (Addendum)
Begin taking naproxen as prescribed.  Take hydrocodone as prescribed as needed for pain not relieved with naproxen.  Wear the arm sling for the next several days, then gradually reintroduce activity as tolerated.  If your symptoms or not improving in the next week, please follow-up with your primary doctor for a recheck.  Return to the ER in the meantime if symptoms significantly worsen or change.

## 2020-04-04 NOTE — ED Notes (Signed)
Rt shoulder pain for 2 days  No known injury

## 2020-04-04 NOTE — ED Notes (Signed)
To x-ray

## 2020-04-04 NOTE — ED Triage Notes (Signed)
Patient stated approx 30 mins ago while at a hair salon, she felt a sudden onset of right sided arm radiated from shoulder side down, and then up from wrist side up followed by chest tightness. Patient also complained of looking swollen on left side of her face.

## 2020-04-04 NOTE — ED Provider Notes (Signed)
[facility]ERGENC[facility] DEPARTMENT Provider Note   CSN: [id number] Arrival date & time: 04/04/20  1402     History Chief Complaint  Patient presents with  . Arm Pain  . Chest Pain  . Facial Swelling    Carly Santiago is a 54 y.o. female.  Patient is a 54 year old female with history of type 2 diabetes.  She presents today for evaluation of left shoulder pain.  Patient started yesterday with pain in the right shoulder when she woke up.  This began in the absence of any injury or trauma.  It became worse throughout the day.  She was having her hair done when she moves her shoulder the wrong direction, then the pain became severe and caused her to become sweaty.  She was then told to come here to be seen over concerns of possible cardiac etiology.  Patient denies to me she is having any chest pain or difficulty breathing.  She denies any nausea.  The history is provided by the patient.  Arm Pain This is a new problem. The current episode started yesterday. The problem occurs constantly. The problem has been rapidly worsening. Associated symptoms include chest pain. Exacerbated by: Movement of shoulder and palpation. Nothing relieves the symptoms. She has tried nothing for the symptoms.  Chest Pain      Past Medical History:  Diagnosis Date  . Diabetes mellitus without complication [facility])     Patient Active Problem List   Diagnosis Date Noted  . Morbid obesity ([place]) 09/21/2017    Past Surgical History:  Procedure Laterality Date  . BREAST SURGERY     breast reduction  . coposcopy     for cervical cancer  . INTRAUTERINE DEVICE (IUD) INSERTION    . IUD REMOVAL  2017  . LAPAROSCOPIC GASTRIC SLEEVE RESECTION N/A 09/21/2017   Procedure: LAPAROSCOPIC GASTRIC SLEEVE RESECTION, Upper Endo;  Surgeon: Excell Seltzer, MD;  Location: [facility];  Service: General;  Laterality: N/A;     OB History   No obstetric history on file.     No family history on  file.  Social History   Tobacco Use  . Smoking status: Never Smoker  . Smokeless tobacco: Never Used  Vaping Use  . Vaping Use: Never used  Substance Use Topics  . Alcohol use: No  . Drug use: No    Home Medications Prior to Admission medications   Medication Sig Start Date End Date Taking? Authorizing Provider  BIOTIN PO Take 1 tablet by mouth daily.    Yes [provider]  Multiple Vitamin (MULTIVITAMIN) capsule Take 1 capsule by mouth daily.   Yes [provider]  Calcium-Iron-Vit D-Vit K (CALCIUM SOFT CHEWS) 500-05-998-40 MG-UNT-MCG CHEW Chew by mouth 3 (three) times daily with meals. Patient not taking: Reported on 04/04/2020    [provider]  Insulin Aspart, w/Niacinamide, (FIASP FLEXTOUCH) 100 UNIT/ML SOPN Inject 5-7 Units into the skin 3 (three) times daily with meals. Patient not taking: Reported on 04/04/2020    [provider]  Jonetta Speak LANCETS 58Y [place]  12/13/16   [provider]  [facility] VERIO test strip  02/28/17   [provider]  oxyCODONE (ROXICODONE) 5 MG/5ML solution Take 5-10 mLs (5-10 mg total) by mouth every 4 (four) hours as needed for moderate pain or severe pain. Patient not taking: Reported on 10/13/2017 09/23/17   Excell Seltzer, MD  phentermine 37.5 MG capsule Take 37.5 mg by mouth every morning. Patient not taking: Reported  on 04/04/2020    [provider]  TRESIBA FLEXTOUCH 100 UNIT/ML SOPN FlexTouch Pen Inject 0.15 mLs (15 Units total) into the skin at bedtime. Patient not taking: Reported on 04/04/2020 09/23/17   Excell Seltzer, MD    Allergies    Patient has no known allergies.  Review of Systems   Review of Systems  Cardiovascular: Positive for chest pain.  All other systems reviewed and are negative.   Physical Exam Updated Vital Signs BP 131/73   Pulse 64   Temp 98.5 F (36.9 C) (Oral)   Resp (!) 25   Ht 5\' 8"  (1.727 m)   Wt 102.5 kg   SpO2 98%   BMI 34.36  kg/m   Physical Exam Vitals and nursing note reviewed.  Constitutional:      General: She is not in acute distress.    Appearance: She is well-developed. She is not diaphoretic.  HENT:     Head: Normocephalic and atraumatic.  Cardiovascular:     Rate and Rhythm: Normal rate and regular rhythm.     Heart sounds: No murmur heard.  No friction rub. No gallop.   Pulmonary:     Effort: Pulmonary effort is normal. No respiratory distress.     Breath sounds: Normal breath sounds. No wheezing.  Abdominal:     General: Bowel sounds are normal. There is no distension.     Palpations: Abdomen is soft.     Tenderness: There is no abdominal tenderness.  Musculoskeletal:        General: Normal range of motion.     Cervical back: Normal range of motion and neck supple.     Comments: The right shoulder appears grossly normal.  There is tenderness to palpation over the posterior and lateral aspect of the deltoid.  She has significant discomfort with any range of motion.  There is no warmth or erythema.  Ulnar and radial pulses are easily palpable and motor and sensation are intact throughout the entire hand.  Skin:    General: Skin is warm and dry.  Neurological:     Mental Status: She is alert and oriented to person, place, and time.     ED Results / Procedures / Treatments   Labs (all labs ordered are listed, but only abnormal results are displayed) Labs Reviewed  BASIC METABOLIC PANEL - Abnormal; Notable for the following components:      Result Value   Glucose, Bld 131 (*)    BUN 5 (*)    All other components within normal limits  CBC  I-STAT BETA HCG BLOOD, ED ([facility], [facility], [facility] ONLY)  TROPONIN I (HIGH SENSITIVITY)    EKG EKG Interpretation  Date/Time:  Wednesday April 04 2020 14:09:43 EST Ventricular Rate:  54 PR Interval:  174 QRS Duration: 74 QT Interval:  432 QTC Calculation: 409 R Axis:   -26 Text Interpretation: Sinus bradycardia Left axis deviation Abnormal ECG No  significant change since 08/13/2017 Confirmed by Veryl Speak [phone]) on 04/04/2020 4:12:14 PM   Radiology DG Chest 2 View  Result Date: 04/04/2020 CLINICAL DATA:  Chest pain EXAM: CHEST - 2 VIEW COMPARISON:  08/13/2017 FINDINGS: Mild cardiomegaly. No confluent opacities, effusions or edema. No acute bony abnormality. IMPRESSION: Cardiomegaly.  No active disease. Electronically Signed   By: Rolm Baptise M.D.   On: 04/04/2020 15:19   DG Shoulder Right  Result Date: 04/04/2020 CLINICAL DATA:  Right shoulder pain. EXAM: RIGHT SHOULDER - 2+ VIEW COMPARISON:  None. FINDINGS: There is no  evidence of fracture or dislocation. There is no evidence of arthropathy or other focal bone abnormality. Soft tissues are unremarkable. IMPRESSION: Negative. Electronically Signed   By: Virgina Norfolk M.D.   On: 04/04/2020 17:01    Procedures Procedures (including critical care time)  Medications Ordered in ED Medications  ketorolac (TORADOL) injection 60 mg (60 mg Intramuscular Given 04/04/20 1636)  HYDROcodone-acetaminophen (NORCO/VICODIN) 5-325 MG per tablet 2 tablet (2 tablets Oral Given 04/04/20 1636)    ED Course  I have reviewed the triage vital signs and the nursing notes.  Pertinent labs & imaging results that were available during my care of the patient were reviewed by me and considered in my medical decision making (see chart for details).    MDM Rules/Calculators/A&P  Patient presenting with right shoulder pain.  I suspect a rotator cuff tendinitis.  Patient was given Toradol and hydrocodone with some relief.  X-rays of her shoulder are unremarkable.  Patient also had a cardiac work-up initiated in triage which is also negative.  I highly doubt a cardiac etiology as her pain is easily reproducible with movement of the shoulder.  At this point, feels the patient is stable for discharge with short-term immobilization with a sling, naproxen, pain medicine, and follow-up with primary doctor if  not improving.  Final Clinical Impression(s) / ED Diagnoses Final diagnoses:  None    Rx / DC Orders ED Discharge Orders    None       Veryl Speak, MD 04/04/20 1742

## 2020-04-04 NOTE — ED Notes (Signed)
Pt refusing to put on gown, cooperative with putting her on the monitor. Pt states that she is here for arm pain only and that there was a mix up with her chief complaint. Dr. Stark Jock at bedside.

## 2020-04-10 ENCOUNTER — Encounter (HOSPITAL_COMMUNITY): Payer: Self-pay

## 2020-06-11 DIAGNOSIS — R635 Abnormal weight gain: Secondary | ICD-10-CM | POA: Diagnosis not present

## 2020-06-11 DIAGNOSIS — N950 Postmenopausal bleeding: Secondary | ICD-10-CM | POA: Diagnosis not present

## 2020-06-11 DIAGNOSIS — Z1231 Encounter for screening mammogram for malignant neoplasm of breast: Secondary | ICD-10-CM | POA: Diagnosis not present

## 2020-06-12 DIAGNOSIS — E119 Type 2 diabetes mellitus without complications: Secondary | ICD-10-CM | POA: Diagnosis not present

## 2020-06-12 DIAGNOSIS — E559 Vitamin D deficiency, unspecified: Secondary | ICD-10-CM | POA: Diagnosis not present

## 2020-06-12 DIAGNOSIS — E669 Obesity, unspecified: Secondary | ICD-10-CM | POA: Diagnosis not present

## 2020-06-12 DIAGNOSIS — E1165 Type 2 diabetes mellitus with hyperglycemia: Secondary | ICD-10-CM | POA: Diagnosis not present

## 2020-06-12 DIAGNOSIS — F439 Reaction to severe stress, unspecified: Secondary | ICD-10-CM | POA: Diagnosis not present

## 2020-06-12 DIAGNOSIS — Z9884 Bariatric surgery status: Secondary | ICD-10-CM | POA: Diagnosis not present

## 2020-06-19 DIAGNOSIS — H2512 Age-related nuclear cataract, left eye: Secondary | ICD-10-CM | POA: Diagnosis not present

## 2020-06-20 DIAGNOSIS — E119 Type 2 diabetes mellitus without complications: Secondary | ICD-10-CM | POA: Diagnosis not present

## 2020-06-20 DIAGNOSIS — Z9884 Bariatric surgery status: Secondary | ICD-10-CM | POA: Diagnosis not present

## 2020-07-06 ENCOUNTER — Other Ambulatory Visit (HOSPITAL_COMMUNITY): Payer: Self-pay | Admitting: Endocrinology

## 2020-07-09 ENCOUNTER — Other Ambulatory Visit (HOSPITAL_COMMUNITY): Payer: Self-pay | Admitting: Endocrinology

## 2020-07-09 MED FILL — FREESTYLE LITE TEST STRIP: 50 days supply | Qty: 50 | Fill #0

## 2020-07-09 MED FILL — FREESTYLE LITE METER: W/DEVICE | 1 days supply | Qty: 1 | Fill #0

## 2020-09-10 DIAGNOSIS — E1165 Type 2 diabetes mellitus with hyperglycemia: Secondary | ICD-10-CM | POA: Diagnosis not present

## 2020-11-16 DIAGNOSIS — Z0000 Encounter for general adult medical examination without abnormal findings: Secondary | ICD-10-CM | POA: Diagnosis not present

## 2020-11-16 DIAGNOSIS — E559 Vitamin D deficiency, unspecified: Secondary | ICD-10-CM | POA: Diagnosis not present

## 2020-11-16 DIAGNOSIS — E1165 Type 2 diabetes mellitus with hyperglycemia: Secondary | ICD-10-CM | POA: Diagnosis not present

## 2020-11-19 ENCOUNTER — Other Ambulatory Visit (HOSPITAL_COMMUNITY): Payer: Self-pay

## 2020-11-19 MED ORDER — OZEMPIC (0.25 OR 0.5 MG/DOSE) 2 MG/1.5ML ~~LOC~~ SOPN
0.5000 mg | PEN_INJECTOR | SUBCUTANEOUS | 6 refills | Status: DC
  Filled 2020-11-19: qty 4.5, 84d supply, fill #0

## 2020-11-27 DIAGNOSIS — E559 Vitamin D deficiency, unspecified: Secondary | ICD-10-CM | POA: Diagnosis not present

## 2020-11-27 DIAGNOSIS — Z23 Encounter for immunization: Secondary | ICD-10-CM | POA: Diagnosis not present

## 2020-11-27 DIAGNOSIS — Z0000 Encounter for general adult medical examination without abnormal findings: Secondary | ICD-10-CM | POA: Diagnosis not present

## 2020-11-27 DIAGNOSIS — E1165 Type 2 diabetes mellitus with hyperglycemia: Secondary | ICD-10-CM | POA: Diagnosis not present

## 2020-11-27 DIAGNOSIS — Z9884 Bariatric surgery status: Secondary | ICD-10-CM | POA: Diagnosis not present

## 2020-12-13 IMAGING — DX DG KNEE COMPLETE 4+V*R*
4 series · 4 of 4 positions shown · non-contrast
Comparison: None

CLINICAL DATA: RIGHT knee pain post fall on 10/04/2019, limited
range of motion, mild swelling

EXAM:
RIGHT KNEE - COMPLETE 4+ VIEW

[knee pa]
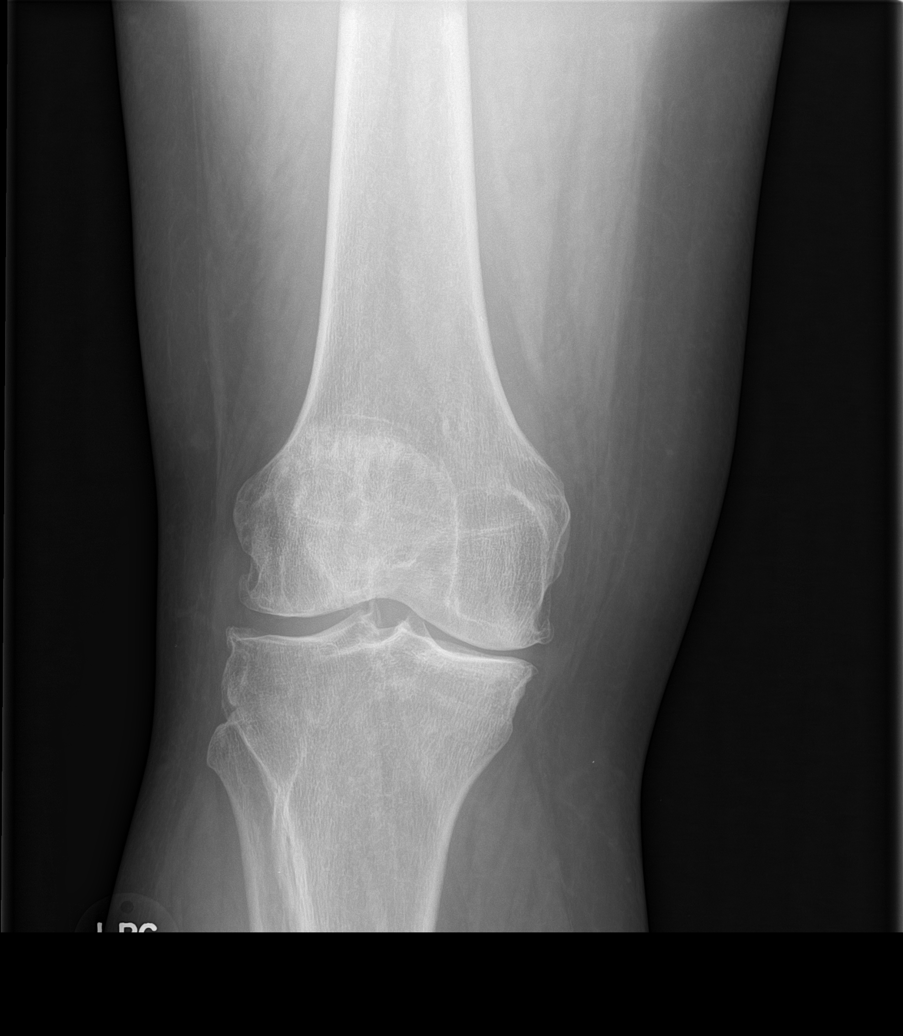

[knee obl (1 of 2)]
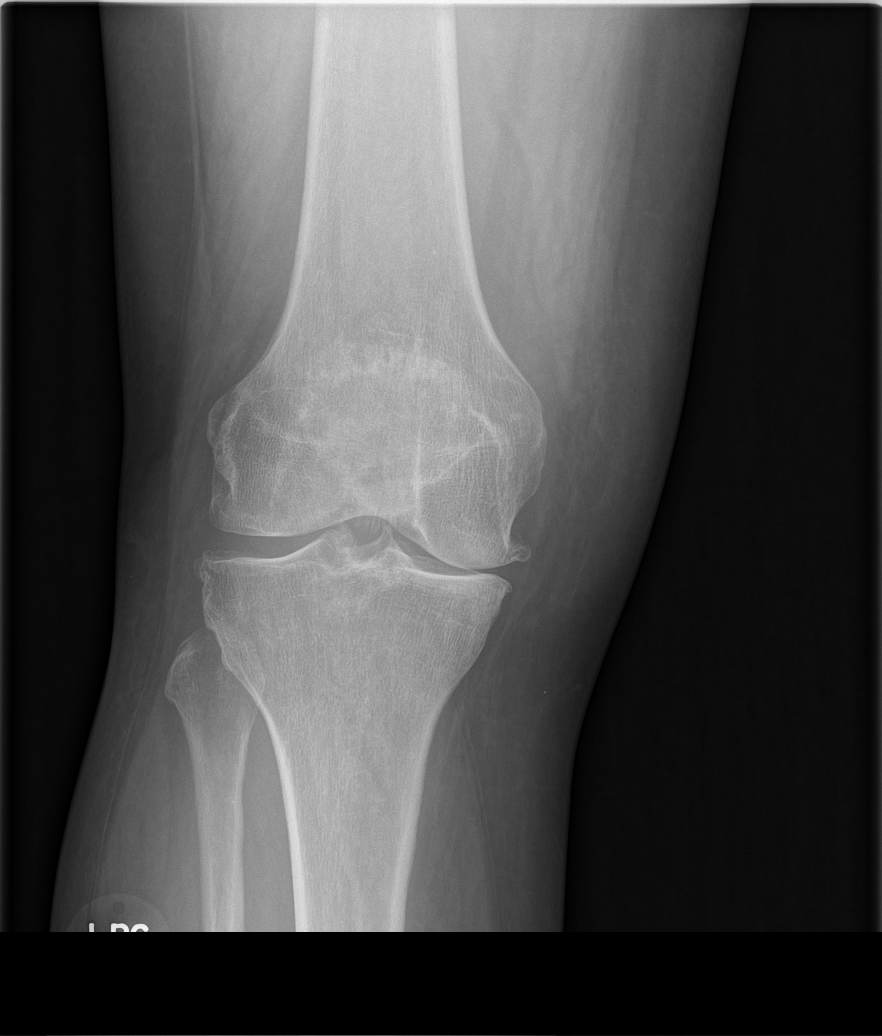

[knee obl (2 of 2)]
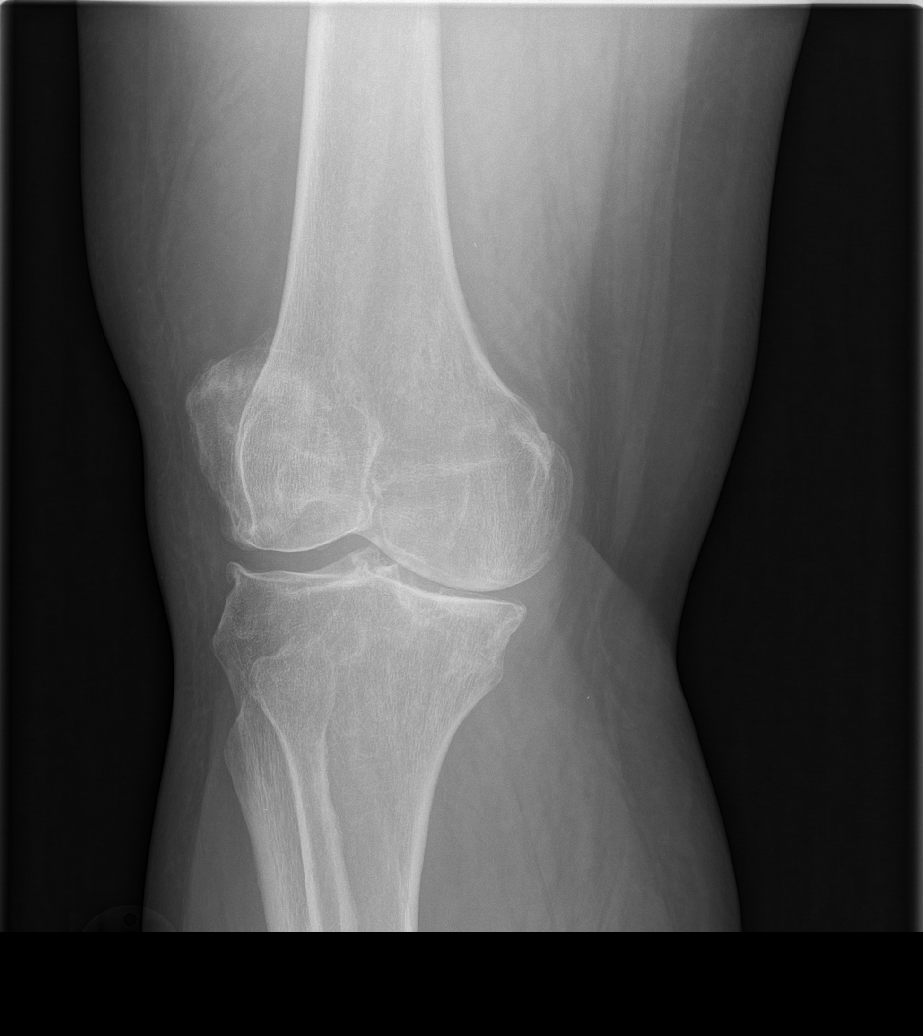

[knee lat]
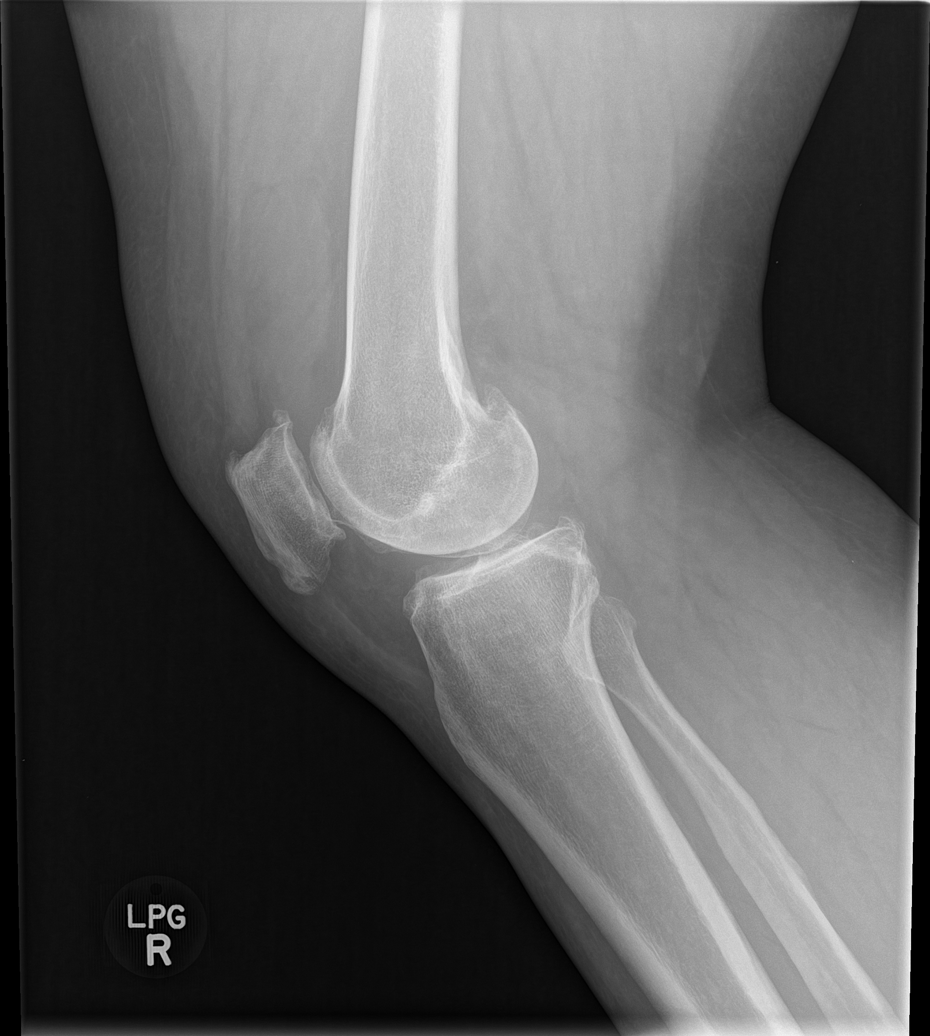

[4 of 4 positions shown; findings below may reference images not displayed]

FINDINGS: Low normal osseous mineralization.

Joint space narrowing and mild marginal spur formation greatest at
medial compartment.

Associated large knee joint effusion.

No acute fracture, dislocation, or bone destruction.
IMPRESSION: Tricompartmental osteoarthritic changes with associated large joint
effusion.

No acute fracture or dislocation identified.

## 2021-01-08 ENCOUNTER — Other Ambulatory Visit (HOSPITAL_COMMUNITY): Payer: Self-pay

## 2021-01-08 MED ORDER — OZEMPIC (2 MG/DOSE) 8 MG/3ML ~~LOC~~ SOPN
2.0000 mg | PEN_INJECTOR | SUBCUTANEOUS | 6 refills | Status: DC
  Filled 2021-01-08 – 2021-01-15 (×2): qty 3, 28d supply, fill #0
  Filled 2021-02-05: qty 3, 28d supply, fill #1
  Filled 2021-02-28: qty 3, 28d supply, fill #2
  Filled 2021-03-26: qty 3, 28d supply, fill #3
  Filled 2021-04-26: qty 3, 28d supply, fill #4
  Filled 2021-05-17 – 2021-06-05 (×2): qty 6, 56d supply, fill #5

## 2021-01-15 ENCOUNTER — Other Ambulatory Visit (HOSPITAL_COMMUNITY): Payer: Self-pay

## 2021-02-05 ENCOUNTER — Other Ambulatory Visit (HOSPITAL_COMMUNITY): Payer: Self-pay

## 2021-02-06 ENCOUNTER — Other Ambulatory Visit (HOSPITAL_COMMUNITY): Payer: Self-pay

## 2021-02-28 ENCOUNTER — Other Ambulatory Visit (HOSPITAL_COMMUNITY): Payer: Self-pay

## 2021-03-13 DIAGNOSIS — Z6834 Body mass index (BMI) 34.0-34.9, adult: Secondary | ICD-10-CM | POA: Diagnosis not present

## 2021-03-13 DIAGNOSIS — E669 Obesity, unspecified: Secondary | ICD-10-CM | POA: Diagnosis not present

## 2021-03-26 ENCOUNTER — Other Ambulatory Visit (HOSPITAL_COMMUNITY): Payer: Self-pay

## 2021-04-15 ENCOUNTER — Other Ambulatory Visit (HOSPITAL_COMMUNITY): Payer: Self-pay

## 2021-04-20 DIAGNOSIS — E119 Type 2 diabetes mellitus without complications: Secondary | ICD-10-CM | POA: Diagnosis not present

## 2021-04-26 ENCOUNTER — Other Ambulatory Visit (HOSPITAL_COMMUNITY): Payer: Self-pay

## 2021-05-17 ENCOUNTER — Other Ambulatory Visit (HOSPITAL_COMMUNITY): Payer: Self-pay

## 2021-05-20 ENCOUNTER — Other Ambulatory Visit (HOSPITAL_COMMUNITY): Payer: Self-pay

## 2021-05-28 ENCOUNTER — Other Ambulatory Visit (HOSPITAL_COMMUNITY): Payer: Self-pay

## 2021-06-05 ENCOUNTER — Other Ambulatory Visit (HOSPITAL_COMMUNITY): Payer: Self-pay

## 2021-07-24 ENCOUNTER — Other Ambulatory Visit (HOSPITAL_COMMUNITY): Payer: Self-pay

## 2021-07-30 ENCOUNTER — Other Ambulatory Visit (HOSPITAL_COMMUNITY): Payer: Self-pay

## 2021-07-31 ENCOUNTER — Other Ambulatory Visit (HOSPITAL_COMMUNITY): Payer: Self-pay

## 2021-07-31 MED ORDER — OZEMPIC (2 MG/DOSE) 8 MG/3ML ~~LOC~~ SOPN
2.0000 mg | PEN_INJECTOR | SUBCUTANEOUS | 2 refills | Status: DC
  Filled 2021-07-31: qty 3, 28d supply, fill #0
  Filled 2021-09-10: qty 3, 28d supply, fill #1
  Filled 2021-10-30: qty 3, 28d supply, fill #2

## 2021-08-07 ENCOUNTER — Other Ambulatory Visit (HOSPITAL_COMMUNITY): Payer: Self-pay

## 2021-09-10 ENCOUNTER — Other Ambulatory Visit (HOSPITAL_COMMUNITY): Payer: Self-pay

## 2021-10-01 ENCOUNTER — Other Ambulatory Visit (HOSPITAL_COMMUNITY): Payer: Self-pay

## 2021-10-02 ENCOUNTER — Other Ambulatory Visit (HOSPITAL_COMMUNITY): Payer: Self-pay

## 2021-10-02 MED ORDER — GLUCOSE BLOOD VI STRP
ORAL_STRIP | 0 refills | Status: AC
  Filled 2021-10-02: qty 50, 50d supply, fill #0

## 2021-10-03 ENCOUNTER — Other Ambulatory Visit (HOSPITAL_COMMUNITY): Payer: Self-pay

## 2021-10-16 DIAGNOSIS — D2239 Melanocytic nevi of other parts of face: Secondary | ICD-10-CM | POA: Diagnosis not present

## 2021-10-16 DIAGNOSIS — L700 Acne vulgaris: Secondary | ICD-10-CM | POA: Diagnosis not present

## 2021-10-16 DIAGNOSIS — L818 Other specified disorders of pigmentation: Secondary | ICD-10-CM | POA: Diagnosis not present

## 2021-10-16 DIAGNOSIS — L738 Other specified follicular disorders: Secondary | ICD-10-CM | POA: Diagnosis not present

## 2021-10-30 ENCOUNTER — Other Ambulatory Visit (HOSPITAL_COMMUNITY): Payer: Self-pay

## 2021-12-23 ENCOUNTER — Other Ambulatory Visit (HOSPITAL_COMMUNITY): Payer: Self-pay

## 2021-12-23 MED ORDER — OZEMPIC (2 MG/DOSE) 8 MG/3ML ~~LOC~~ SOPN
2.0000 mg | PEN_INJECTOR | SUBCUTANEOUS | 0 refills | Status: AC
  Filled 2021-12-23: qty 3, 28d supply, fill #0

## 2021-12-24 DIAGNOSIS — E1165 Type 2 diabetes mellitus with hyperglycemia: Secondary | ICD-10-CM | POA: Diagnosis not present

## 2021-12-30 DIAGNOSIS — E1165 Type 2 diabetes mellitus with hyperglycemia: Secondary | ICD-10-CM | POA: Diagnosis not present

## 2021-12-30 DIAGNOSIS — E669 Obesity, unspecified: Secondary | ICD-10-CM | POA: Diagnosis not present

## 2021-12-30 DIAGNOSIS — E559 Vitamin D deficiency, unspecified: Secondary | ICD-10-CM | POA: Diagnosis not present

## 2021-12-30 DIAGNOSIS — F439 Reaction to severe stress, unspecified: Secondary | ICD-10-CM | POA: Diagnosis not present

## 2021-12-30 DIAGNOSIS — N951 Menopausal and female climacteric states: Secondary | ICD-10-CM | POA: Diagnosis not present

## 2021-12-30 DIAGNOSIS — E7800 Pure hypercholesterolemia, unspecified: Secondary | ICD-10-CM | POA: Diagnosis not present

## 2021-12-30 DIAGNOSIS — Z9884 Bariatric surgery status: Secondary | ICD-10-CM | POA: Diagnosis not present

## 2022-04-18 ENCOUNTER — Encounter (HOSPITAL_COMMUNITY): Payer: Self-pay | Admitting: *Deleted

## 2022-06-03 DIAGNOSIS — K429 Umbilical hernia without obstruction or gangrene: Secondary | ICD-10-CM | POA: Diagnosis not present

## 2022-06-03 DIAGNOSIS — Z903 Acquired absence of stomach [part of]: Secondary | ICD-10-CM | POA: Diagnosis not present

## 2022-06-03 DIAGNOSIS — K219 Gastro-esophageal reflux disease without esophagitis: Secondary | ICD-10-CM | POA: Diagnosis not present

## 2022-06-03 DIAGNOSIS — M6208 Separation of muscle (nontraumatic), other site: Secondary | ICD-10-CM | POA: Diagnosis not present

## 2022-06-22 DIAGNOSIS — E119 Type 2 diabetes mellitus without complications: Secondary | ICD-10-CM | POA: Diagnosis not present

## 2022-07-02 DIAGNOSIS — N951 Menopausal and female climacteric states: Secondary | ICD-10-CM | POA: Diagnosis not present

## 2022-07-02 DIAGNOSIS — E559 Vitamin D deficiency, unspecified: Secondary | ICD-10-CM | POA: Diagnosis not present

## 2022-07-02 DIAGNOSIS — E1165 Type 2 diabetes mellitus with hyperglycemia: Secondary | ICD-10-CM | POA: Diagnosis not present

## 2022-07-03 ENCOUNTER — Other Ambulatory Visit (HOSPITAL_COMMUNITY): Payer: Self-pay

## 2022-07-03 ENCOUNTER — Other Ambulatory Visit: Payer: Self-pay

## 2022-07-03 MED ORDER — OZEMPIC (2 MG/DOSE) 8 MG/3ML ~~LOC~~ SOPN
2.0000 mg | PEN_INJECTOR | SUBCUTANEOUS | 0 refills | Status: DC
  Filled 2022-07-03: qty 3, 28d supply, fill #0

## 2022-07-04 ENCOUNTER — Other Ambulatory Visit (HOSPITAL_COMMUNITY): Payer: Self-pay

## 2022-07-04 MED ORDER — OZEMPIC (2 MG/DOSE) 8 MG/3ML ~~LOC~~ SOPN
2.0000 mg | PEN_INJECTOR | SUBCUTANEOUS | 0 refills | Status: DC
  Filled 2022-07-04 – 2022-08-05 (×2): qty 3, 28d supply, fill #0

## 2022-08-05 ENCOUNTER — Other Ambulatory Visit (HOSPITAL_COMMUNITY): Payer: Self-pay

## 2022-08-14 ENCOUNTER — Encounter: Payer: Self-pay | Admitting: Plastic Surgery

## 2022-08-14 ENCOUNTER — Ambulatory Visit: Payer: Commercial Managed Care - PPO | Admitting: Plastic Surgery

## 2022-08-14 VITALS — BP 149/70 | HR 73 | Ht 69.0 in | Wt 213.4 lb

## 2022-08-14 DIAGNOSIS — Z9884 Bariatric surgery status: Secondary | ICD-10-CM | POA: Diagnosis not present

## 2022-08-14 DIAGNOSIS — M793 Panniculitis, unspecified: Secondary | ICD-10-CM

## 2022-08-14 DIAGNOSIS — Z6831 Body mass index (BMI) 31.0-31.9, adult: Secondary | ICD-10-CM

## 2022-08-14 DIAGNOSIS — M546 Pain in thoracic spine: Secondary | ICD-10-CM

## 2022-08-14 DIAGNOSIS — R21 Rash and other nonspecific skin eruption: Secondary | ICD-10-CM | POA: Diagnosis not present

## 2022-08-14 DIAGNOSIS — M549 Dorsalgia, unspecified: Secondary | ICD-10-CM | POA: Insufficient documentation

## 2022-08-14 DIAGNOSIS — G8929 Other chronic pain: Secondary | ICD-10-CM

## 2022-08-14 NOTE — Progress Notes (Signed)
Patient ID: Carly Santiago, female    DOB: Nov 07, 1965, 57 y.o.   MRN: [id number]   Chief Complaint  Patient presents with   consult    The patient is a 57 year old female here for evaluation of her abdomen.  She is 5 feet 9 inches tall and weighs 213 pounds.  Her BMI is 31.5 kg/m.  She had a gastric sleeve done 4 years ago by Dr. Excell Seltzer and was able to lose approximately 50 pounds.  Her diabetes is well-controlled.  She had a breast reduction about 10 years ago and did well from that.  She also has a hernia of the upper abdominal area and would like to have that fixed at the same time as possibly a panniculectomy.  She is on Ozempic and would like to get below 200 pounds.  She is not a smoker.  She has good strength of her abdominal muscles.  I did not feel the hernia but she did tell me about it.  She does complain of rashes and skin breakdown in her folds.    Review of Systems  Constitutional: Negative.   HENT: Negative.    Eyes: Negative.   Respiratory: Negative.    Cardiovascular: Negative.   Gastrointestinal: Negative.   Endocrine: Negative.   Genitourinary: Negative.   Musculoskeletal:  Positive for back pain.  Skin:  Positive for rash.    Past Medical History:  Diagnosis Date   Diabetes mellitus without complication     Past Surgical History:  Procedure Laterality Date   BREAST SURGERY     breast reduction   coposcopy     for cervical cancer   INTRAUTERINE DEVICE (IUD) INSERTION     IUD REMOVAL  2017   LAPAROSCOPIC GASTRIC SLEEVE RESECTION N/A 09/21/2017   Procedure: LAPAROSCOPIC GASTRIC SLEEVE RESECTION, Upper Endo;  Surgeon: Excell Seltzer, MD;  Location: [facility];  Service: General;  Laterality: N/A;      Current Outpatient Medications:    BIOTIN PO, Take 1 tablet by mouth daily. , Disp: , Rfl:    Multiple Vitamin (MULTIVITAMIN) capsule, Take 1 capsule by mouth daily., Disp: , Rfl:    phentermine 37.5 MG capsule, Take 37.5 mg by mouth every morning.,  Disp: , Rfl:    Semaglutide, 2 MG/DOSE, (OZEMPIC, 2 MG/DOSE,) 8 MG/3ML SOPN, Inject 2 mg into the skin once a week., Disp: 3 mL, Rfl: 0   Semaglutide, 2 MG/DOSE, (OZEMPIC, 2 MG/DOSE,) 8 MG/3ML SOPN, Inject 2 mg into the skin once a week., Disp: 3 mL, Rfl: 0   tretinoin (RETIN-A) 0.1 % cream, SMARTSIG:Sparingly Topical Every Night, Disp: , Rfl:    Calcium-Iron-Vit D-Vit K (CALCIUM SOFT CHEWS) 500-05-998-40 MG-UNT-MCG CHEW, Chew by mouth 3 (three) times daily with meals., Disp: , Rfl:    glucose blood test strip, Use as directed once daily, Disp: 50 each, Rfl: 0   HYDROcodone-acetaminophen (NORCO) 5-325 MG tablet, Take 1-2 tablets by mouth every 6 (six) hours as needed., Disp: 15 tablet, Rfl: 0   Insulin Aspart, w/Niacinamide, (FIASP FLEXTOUCH) 100 UNIT/ML SOPN, Inject 5-7 Units into the skin 3 (three) times daily with meals., Disp: , Rfl:    naproxen (NAPROSYN) 500 MG tablet, Take 1 tablet (500 mg total) by mouth 2 (two) times daily., Disp: 15 tablet, Rfl: 0   ONETOUCH DELICA LANCETS [id number] MISC, , Disp: , Rfl:    ONETOUCH VERIO test strip, , Disp: , Rfl:    oxyCODONE (ROXICODONE) 5 MG/5ML solution, Take 5-10 mLs (5-10  mg total) by mouth every 4 (four) hours as needed for moderate pain or severe pain., Disp: , Rfl: 0   TRESIBA FLEXTOUCH 100 UNIT/ML SOPN FlexTouch Pen, Inject 0.15 mLs (15 Units total) into the skin at bedtime., Disp: , Rfl:    Objective:   Vitals:   08/14/22 1447  BP: (!) 149/70  Pulse: 73  SpO2: 98%    Physical Exam Vitals and nursing note reviewed.  Constitutional:      Appearance: Normal appearance.  Cardiovascular:     Rate and Rhythm: Normal rate.     Pulses: Normal pulses.  Pulmonary:     Effort: Pulmonary effort is normal.  Abdominal:     Palpations: Abdomen is soft.  Skin:    General: Skin is warm.     Coloration: Skin is not jaundiced.     Findings: No bruising.  Neurological:     Mental Status: She is alert and oriented to person, place, and time.   Psychiatric:        Mood and Affect: Mood normal.        Behavior: Behavior normal.        Thought Content: Thought content normal.        Judgment: Judgment normal.     Assessment & Plan:  Chronic bilateral thoracic back pain  Panniculitis  The patient is a very good candidate for panniculectomy.  I do think that she would benefit from a full abdominoplasty in conjunction with repair of her hernia.  She would also like to consider liposuction.  I will send her a quote for if its added on.  She does want to wait a few months and see if she can decrease her weight a little bit more.  Pictures were obtained of the patient and placed in the chart with the patient's or guardian's permission.   [place], DO

## 2022-08-28 ENCOUNTER — Telehealth: Payer: Self-pay | Admitting: *Deleted

## 2022-08-28 NOTE — Telephone Encounter (Signed)
Quote to add-on upper abdomen to panniculectomy surgery mailed to patient. Mychart is not active

## 2022-08-28 NOTE — Telephone Encounter (Signed)
error 

## 2022-09-05 ENCOUNTER — Telehealth: Payer: Self-pay | Admitting: *Deleted

## 2022-09-05 NOTE — Telephone Encounter (Signed)
Pt received quote in the mail. She would like to add-on the upper abdomen and liposuction to the panniculectomy surgery. Change approved by Dr. Ulice Bold. Pt aware new quote including liposuction will be mailed to her and adding liposuction will increase provider fee from $2500 to $3500

## 2022-09-09 ENCOUNTER — Other Ambulatory Visit (HOSPITAL_COMMUNITY): Payer: Self-pay

## 2022-09-10 ENCOUNTER — Telehealth: Payer: Self-pay | Admitting: Plastic Surgery

## 2022-09-10 ENCOUNTER — Other Ambulatory Visit (HOSPITAL_COMMUNITY): Payer: Self-pay

## 2022-09-10 NOTE — Telephone Encounter (Signed)
Pending Ref# [id number] for panniculectomy

## 2022-09-11 DIAGNOSIS — E1165 Type 2 diabetes mellitus with hyperglycemia: Secondary | ICD-10-CM | POA: Diagnosis not present

## 2022-09-11 DIAGNOSIS — E669 Obesity, unspecified: Secondary | ICD-10-CM | POA: Diagnosis not present

## 2022-09-11 DIAGNOSIS — E559 Vitamin D deficiency, unspecified: Secondary | ICD-10-CM | POA: Diagnosis not present

## 2022-09-11 DIAGNOSIS — E7800 Pure hypercholesterolemia, unspecified: Secondary | ICD-10-CM | POA: Diagnosis not present

## 2022-09-17 ENCOUNTER — Other Ambulatory Visit (HOSPITAL_COMMUNITY): Payer: Self-pay

## 2022-09-17 DIAGNOSIS — E1165 Type 2 diabetes mellitus with hyperglycemia: Secondary | ICD-10-CM | POA: Diagnosis not present

## 2022-09-17 DIAGNOSIS — E669 Obesity, unspecified: Secondary | ICD-10-CM | POA: Diagnosis not present

## 2022-09-17 DIAGNOSIS — F439 Reaction to severe stress, unspecified: Secondary | ICD-10-CM | POA: Diagnosis not present

## 2022-09-17 DIAGNOSIS — Z9884 Bariatric surgery status: Secondary | ICD-10-CM | POA: Diagnosis not present

## 2022-09-17 MED ORDER — GLUCOSE BLOOD VI STRP
ORAL_STRIP | 0 refills | Status: AC
  Filled 2022-09-17: qty 50, 50d supply, fill #0

## 2022-09-17 MED ORDER — FREESTYLE LITE W/DEVICE KIT
PACK | 1 refills | Status: AC
  Filled 2022-09-17: qty 1, 30d supply, fill #0

## 2022-09-17 MED ORDER — OZEMPIC (2 MG/DOSE) 8 MG/3ML ~~LOC~~ SOPN
2.0000 mg | PEN_INJECTOR | SUBCUTANEOUS | 5 refills | Status: DC
  Filled 2022-09-17: qty 9, 84d supply, fill #0
  Filled 2022-12-26: qty 9, 84d supply, fill #1
  Filled 2023-07-09 – 2023-08-13 (×2): qty 3, 28d supply, fill #2
  Filled 2023-08-24: qty 9, 84d supply, fill #2

## 2022-09-17 MED ORDER — VITAMIN D (ERGOCALCIFEROL) 1.25 MG (50000 UNIT) PO CAPS
50000.0000 [IU] | ORAL_CAPSULE | ORAL | 1 refills | Status: DC
  Filled 2022-09-17: qty 12, 84d supply, fill #0

## 2022-10-03 ENCOUNTER — Telehealth: Payer: Self-pay | Admitting: Plastic Surgery

## 2022-10-03 NOTE — Telephone Encounter (Signed)
Attempted to call pt and phone number on file not in working. [phone] and no my chart accessibility.

## 2022-10-10 DIAGNOSIS — E669 Obesity, unspecified: Secondary | ICD-10-CM | POA: Diagnosis not present

## 2022-10-10 DIAGNOSIS — Z01419 Encounter for gynecological examination (general) (routine) without abnormal findings: Secondary | ICD-10-CM | POA: Diagnosis not present

## 2022-10-10 DIAGNOSIS — Z1231 Encounter for screening mammogram for malignant neoplasm of breast: Secondary | ICD-10-CM | POA: Diagnosis not present

## 2022-10-10 DIAGNOSIS — Z124 Encounter for screening for malignant neoplasm of cervix: Secondary | ICD-10-CM | POA: Diagnosis not present

## 2022-10-10 DIAGNOSIS — N959 Unspecified menopausal and perimenopausal disorder: Secondary | ICD-10-CM | POA: Diagnosis not present

## 2022-10-10 DIAGNOSIS — Z01411 Encounter for gynecological examination (general) (routine) with abnormal findings: Secondary | ICD-10-CM | POA: Diagnosis not present

## 2022-10-10 DIAGNOSIS — Z113 Encounter for screening for infections with a predominantly sexual mode of transmission: Secondary | ICD-10-CM | POA: Diagnosis not present

## 2022-10-10 DIAGNOSIS — Z6837 Body mass index (BMI) 37.0-37.9, adult: Secondary | ICD-10-CM | POA: Diagnosis not present

## 2022-10-10 DIAGNOSIS — Z1211 Encounter for screening for malignant neoplasm of colon: Secondary | ICD-10-CM | POA: Diagnosis not present

## 2022-10-10 DIAGNOSIS — Z1212 Encounter for screening for malignant neoplasm of rectum: Secondary | ICD-10-CM | POA: Diagnosis not present

## 2022-10-21 ENCOUNTER — Ambulatory Visit (INDEPENDENT_AMBULATORY_CARE_PROVIDER_SITE_OTHER): Payer: Commercial Managed Care - PPO | Admitting: Plastic Surgery

## 2022-10-21 ENCOUNTER — Telehealth: Payer: Self-pay | Admitting: *Deleted

## 2022-10-21 DIAGNOSIS — M793 Panniculitis, unspecified: Secondary | ICD-10-CM | POA: Diagnosis not present

## 2022-10-21 NOTE — Telephone Encounter (Signed)
Self-pay quote mailed to pt. Dr. Ulice Bold informed her of this during today's tele visit

## 2022-10-21 NOTE — Telephone Encounter (Signed)
Attempted to reach patient to offer self-pay quote information since panniculectomy was denied by her insurance. NA/unable to leave VM "mailbox full". Mychart is not active.

## 2022-10-21 NOTE — Progress Notes (Signed)
   Subjective:    Patient ID: Carly Santiago, female    DOB: [birth year], 57 y.o.   MRN: [id number]  HPI The patient is a 27 female joining me by phone for further discussion about her abdomen.  She is 5 feet 9 inches tall and weighs around 213 pounds.  She had a gastric sleeve 4 years ago and lost around 50 pounds.  Her diabetes is well-controlled.  She is on Ozempic and is working on weight reduction.  She complains of skin breakdown and she would like to coordinate with general surgery for hernia repair at the same time.   Review of Systems  Constitutional: Negative.   Eyes: Negative.   Respiratory: Negative.    Cardiovascular: Negative.   Gastrointestinal: Negative.   Endocrine: Negative.   Genitourinary: Negative.   Musculoskeletal:  Positive for back pain and neck pain.       Objective:   Physical Exam        Assessment & Plan:     ICD-10-CM   1. Panniculitis  M79.3        I connected with  Lesli L Ra on 10/21/22 by phone and verified that I am speaking with the correct person using two identifiers.  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  We spent 5 minutes in discussion.   I discussed the limitations of evaluation and management by telemedicine. The patient expressed understanding and agreed to proceed.  We discussed options for fee-for-service abdominoplasty and we will send her a quote.  The other option is to continue documenting the skin breakdown and try and submit to insurance again.  Patient is going to think it over and let us know what she would like to do.

## 2022-10-30 ENCOUNTER — Other Ambulatory Visit (HOSPITAL_COMMUNITY): Payer: Self-pay

## 2022-12-26 ENCOUNTER — Other Ambulatory Visit: Payer: Self-pay

## 2022-12-26 ENCOUNTER — Other Ambulatory Visit (HOSPITAL_COMMUNITY): Payer: Self-pay

## 2022-12-30 ENCOUNTER — Other Ambulatory Visit (HOSPITAL_COMMUNITY): Payer: Self-pay

## 2023-03-13 DIAGNOSIS — E669 Obesity, unspecified: Secondary | ICD-10-CM | POA: Diagnosis not present

## 2023-03-13 DIAGNOSIS — E7800 Pure hypercholesterolemia, unspecified: Secondary | ICD-10-CM | POA: Diagnosis not present

## 2023-03-13 DIAGNOSIS — E1165 Type 2 diabetes mellitus with hyperglycemia: Secondary | ICD-10-CM | POA: Diagnosis not present

## 2023-03-13 DIAGNOSIS — E559 Vitamin D deficiency, unspecified: Secondary | ICD-10-CM | POA: Diagnosis not present

## 2023-03-20 ENCOUNTER — Other Ambulatory Visit (HOSPITAL_COMMUNITY): Payer: Self-pay

## 2023-03-20 DIAGNOSIS — Z9884 Bariatric surgery status: Secondary | ICD-10-CM | POA: Diagnosis not present

## 2023-03-20 DIAGNOSIS — E1165 Type 2 diabetes mellitus with hyperglycemia: Secondary | ICD-10-CM | POA: Diagnosis not present

## 2023-03-20 DIAGNOSIS — E669 Obesity, unspecified: Secondary | ICD-10-CM | POA: Diagnosis not present

## 2023-03-20 DIAGNOSIS — F439 Reaction to severe stress, unspecified: Secondary | ICD-10-CM | POA: Diagnosis not present

## 2023-03-20 MED ORDER — OZEMPIC (2 MG/DOSE) 8 MG/3ML ~~LOC~~ SOPN
2.0000 mg | PEN_INJECTOR | SUBCUTANEOUS | 5 refills | Status: DC
  Filled 2023-03-20 – 2023-04-06 (×2): qty 9, 84d supply, fill #0
  Filled 2023-07-09 – 2023-07-30 (×6): qty 3, 28d supply, fill #1
  Filled 2023-11-10 – 2023-11-20 (×2): qty 3, 28d supply, fill #2
  Filled 2023-11-23: qty 9, 84d supply, fill #2
  Filled 2024-02-10: qty 9, 84d supply, fill #3

## 2023-03-20 MED ORDER — VITAMIN D (ERGOCALCIFEROL) 1.25 MG (50000 UNIT) PO CAPS
50000.0000 [IU] | ORAL_CAPSULE | ORAL | 5 refills | Status: DC
  Filled 2023-03-20 – 2023-04-06 (×2): qty 12, 84d supply, fill #0

## 2023-04-01 ENCOUNTER — Other Ambulatory Visit (HOSPITAL_COMMUNITY): Payer: Self-pay

## 2023-04-06 ENCOUNTER — Other Ambulatory Visit (HOSPITAL_COMMUNITY): Payer: Self-pay

## 2023-04-06 ENCOUNTER — Emergency Department (HOSPITAL_COMMUNITY)
Admission: EM | Admit: 2023-04-06 | Discharge: 2023-04-06 | Disposition: A | Discharge status: Home / Self Care | Payer: Commercial Managed Care - PPO | Attending: Emergency Medicine | Admitting: Emergency Medicine

## 2023-04-06 ENCOUNTER — Emergency Department (HOSPITAL_COMMUNITY): Payer: Commercial Managed Care - PPO

## 2023-04-06 DIAGNOSIS — Z794 Long term (current) use of insulin: Secondary | ICD-10-CM | POA: Diagnosis not present

## 2023-04-06 DIAGNOSIS — M542 Cervicalgia: Secondary | ICD-10-CM | POA: Diagnosis not present

## 2023-04-06 DIAGNOSIS — M545 Low back pain, unspecified: Secondary | ICD-10-CM | POA: Diagnosis not present

## 2023-04-06 DIAGNOSIS — S299XXA Unspecified injury of thorax, initial encounter: Secondary | ICD-10-CM | POA: Diagnosis not present

## 2023-04-06 DIAGNOSIS — R079 Chest pain, unspecified: Secondary | ICD-10-CM | POA: Diagnosis not present

## 2023-04-06 DIAGNOSIS — M546 Pain in thoracic spine: Secondary | ICD-10-CM | POA: Insufficient documentation

## 2023-04-06 DIAGNOSIS — S199XXA Unspecified injury of neck, initial encounter: Secondary | ICD-10-CM | POA: Diagnosis not present

## 2023-04-06 DIAGNOSIS — R0789 Other chest pain: Secondary | ICD-10-CM | POA: Insufficient documentation

## 2023-04-06 DIAGNOSIS — Y92410 Unspecified street and highway as the place of occurrence of the external cause: Secondary | ICD-10-CM | POA: Insufficient documentation

## 2023-04-06 DIAGNOSIS — S0990XA Unspecified injury of head, initial encounter: Secondary | ICD-10-CM | POA: Diagnosis not present

## 2023-04-06 DIAGNOSIS — E119 Type 2 diabetes mellitus without complications: Secondary | ICD-10-CM | POA: Diagnosis not present

## 2023-04-06 DIAGNOSIS — S3992XA Unspecified injury of lower back, initial encounter: Secondary | ICD-10-CM | POA: Diagnosis not present

## 2023-04-06 DIAGNOSIS — E876 Hypokalemia: Secondary | ICD-10-CM | POA: Diagnosis not present

## 2023-04-06 DIAGNOSIS — S3993XA Unspecified injury of pelvis, initial encounter: Secondary | ICD-10-CM | POA: Diagnosis not present

## 2023-04-06 DIAGNOSIS — S3991XA Unspecified injury of abdomen, initial encounter: Secondary | ICD-10-CM | POA: Diagnosis not present

## 2023-04-06 DIAGNOSIS — I10 Essential (primary) hypertension: Secondary | ICD-10-CM | POA: Diagnosis not present

## 2023-04-06 DIAGNOSIS — J9811 Atelectasis: Secondary | ICD-10-CM | POA: Diagnosis not present

## 2023-04-06 LAB — COMPREHENSIVE METABOLIC PANEL
ALT: 9 U/L (ref 0–44)
AST: 15 U/L (ref 15–41)
Albumin: 3.6 g/dL (ref 3.5–5.0)
Alkaline Phosphatase: 62 U/L (ref 38–126)
Anion gap: 9 (ref 5–15)
BUN: 6 mg/dL (ref 6–20)
CO2: 26 mmol/L (ref 22–32)
Calcium: 9 mg/dL (ref 8.9–10.3)
Chloride: 102 mmol/L (ref 98–111)
Creatinine, Ser: 0.73 mg/dL (ref 0.44–1.00)
GFR, Estimated: >60 mL/min (ref >60)
Glucose, Bld: 93 mg/dL (ref 70–99)
Potassium: 3.2 mmol/L — ABNORMAL LOW (ref 3.5–5.1)
Sodium: 137 mmol/L (ref 135–145)
Total Bilirubin: 0.5 mg/dL (ref <1.2)
Total Protein: 7.4 g/dL (ref 6.5–8.1)

## 2023-04-06 LAB — CBC
HCT: 37.6 % (ref 36.0–46.0)
Hemoglobin: 12.2 g/dL (ref 12.0–15.0)
MCH: 29.1 pg (ref 26.0–34.0)
MCHC: 32.4 g/dL (ref 30.0–36.0)
MCV: 89.7 fL (ref 80.0–100.0)
Platelets: 292 10*3/uL (ref 150–400)
RBC: 4.19 MIL/uL (ref 3.87–5.11)
RDW: 13.9 % (ref 11.5–15.5)
WBC: 4 10*3/uL (ref 4.0–10.5)
nRBC: 0 % (ref 0.0–0.2)

## 2023-04-06 LAB — ETHANOL: Alcohol, Ethyl (B): <10 mg/dL (ref <10)

## 2023-04-06 LAB — MAGNESIUM: Magnesium: 2.1 mg/dL (ref 1.7–2.4)

## 2023-04-06 LAB — TROPONIN I (HIGH SENSITIVITY): Troponin I (High Sensitivity): 6 ng/L (ref <18)

## 2023-04-06 MED ORDER — IOHEXOL 350 MG/ML SOLN
75.0000 mL | Freq: Once | INTRAVENOUS | Status: AC | PRN
  Administered 2023-04-06: 75 mL via INTRAVENOUS

## 2023-04-06 MED ORDER — FENTANYL CITRATE PF 50 MCG/ML IJ SOSY
25.0000 ug | PREFILLED_SYRINGE | Freq: Once | INTRAMUSCULAR | Status: AC
  Administered 2023-04-06: 25 ug via INTRAVENOUS
  Filled 2023-04-06: qty 1

## 2023-04-06 MED ORDER — POTASSIUM CHLORIDE CRYS ER 20 MEQ PO TBCR
20.0000 meq | EXTENDED_RELEASE_TABLET | Freq: Once | ORAL | Status: AC
  Administered 2023-04-06: 20 meq via ORAL
  Filled 2023-04-06: qty 1

## 2023-04-06 NOTE — Discharge Instructions (Addendum)
Your trauma imaging was negative.  Your muscle aches and pains may be worse before they get better over the next 24 hours.  Recommend Tylenol and ibuprofen for pain control.  Your potassium was mildly low and was supplemented orally.

## 2023-04-06 NOTE — ED Notes (Signed)
Pt at CT
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
## 2023-04-06 NOTE — ED Notes (Signed)
Pt back from ct

## 2023-04-06 NOTE — ED Notes (Signed)
This RN reviewed discharge instructions with patient. She verbalized understanding and denied any further questions. PT well appearing upon discharge and reports tolerable pain. Pt ambulated with stable gait to exit. Pt endorses ride home.  

## 2023-06-16 ENCOUNTER — Other Ambulatory Visit (HOSPITAL_COMMUNITY): Payer: Self-pay

## 2023-06-16 MED ORDER — VITAMIN D (ERGOCALCIFEROL) 1.25 MG (50000 UNIT) PO CAPS
50000.0000 [IU] | ORAL_CAPSULE | ORAL | 5 refills | Status: AC
  Filled 2023-06-16: qty 12, 84d supply, fill #0
  Filled 2023-11-10 – 2023-11-20 (×2): qty 12, 84d supply, fill #1
  Filled 2024-02-10: qty 12, 84d supply, fill #2

## 2023-07-09 ENCOUNTER — Other Ambulatory Visit: Payer: Self-pay

## 2023-07-09 ENCOUNTER — Other Ambulatory Visit (HOSPITAL_COMMUNITY): Payer: Self-pay

## 2023-07-10 ENCOUNTER — Other Ambulatory Visit (HOSPITAL_COMMUNITY): Payer: Self-pay

## 2023-07-10 MED ORDER — FREESTYLE LITE TEST VI STRP
1.0000 | ORAL_STRIP | Freq: Every day | 0 refills | Status: AC
  Filled 2023-07-10 – 2023-07-30 (×2): qty 50, 50d supply, fill #0

## 2023-07-13 ENCOUNTER — Other Ambulatory Visit (HOSPITAL_COMMUNITY): Payer: Self-pay

## 2023-07-14 ENCOUNTER — Other Ambulatory Visit (HOSPITAL_COMMUNITY): Payer: Self-pay

## 2023-07-22 ENCOUNTER — Other Ambulatory Visit (HOSPITAL_COMMUNITY): Payer: Self-pay

## 2023-07-24 ENCOUNTER — Other Ambulatory Visit (HOSPITAL_COMMUNITY): Payer: Self-pay

## 2023-07-28 DIAGNOSIS — H2512 Age-related nuclear cataract, left eye: Secondary | ICD-10-CM | POA: Diagnosis not present

## 2023-07-30 ENCOUNTER — Other Ambulatory Visit (HOSPITAL_COMMUNITY): Payer: Self-pay

## 2023-07-30 MED ORDER — FREESTYLE LANCETS MISC
4 refills | Status: AC
  Filled 2023-07-30: qty 100, 90d supply, fill #0

## 2023-08-05 ENCOUNTER — Other Ambulatory Visit (HOSPITAL_COMMUNITY): Payer: Self-pay

## 2023-08-06 ENCOUNTER — Other Ambulatory Visit (HOSPITAL_COMMUNITY): Payer: Self-pay

## 2023-08-11 ENCOUNTER — Other Ambulatory Visit (HOSPITAL_COMMUNITY): Payer: Self-pay

## 2023-08-13 ENCOUNTER — Other Ambulatory Visit (HOSPITAL_COMMUNITY): Payer: Self-pay

## 2023-08-24 ENCOUNTER — Other Ambulatory Visit (HOSPITAL_COMMUNITY): Payer: Self-pay

## 2023-08-25 ENCOUNTER — Other Ambulatory Visit (HOSPITAL_COMMUNITY): Payer: Self-pay

## 2023-08-26 ENCOUNTER — Other Ambulatory Visit (HOSPITAL_COMMUNITY): Payer: Self-pay

## 2023-10-14 ENCOUNTER — Other Ambulatory Visit (HOSPITAL_COMMUNITY): Payer: Self-pay

## 2023-10-14 MED ORDER — ERYTHROMYCIN 5 MG/GM OP OINT
TOPICAL_OINTMENT | Freq: Two times a day (BID) | OPHTHALMIC | 0 refills | Status: DC
  Filled 2023-10-14: qty 3.5, 5d supply, fill #0

## 2023-10-14 MED ORDER — CEPHALEXIN 500 MG PO CAPS
500.0000 mg | ORAL_CAPSULE | Freq: Two times a day (BID) | ORAL | 0 refills | Status: DC
  Filled 2023-10-14: qty 14, 7d supply, fill #0

## 2023-11-10 ENCOUNTER — Other Ambulatory Visit (HOSPITAL_COMMUNITY): Payer: Self-pay

## 2023-11-19 ENCOUNTER — Other Ambulatory Visit (HOSPITAL_COMMUNITY): Payer: Self-pay

## 2023-11-20 ENCOUNTER — Other Ambulatory Visit (HOSPITAL_COMMUNITY): Payer: Self-pay

## 2023-11-23 ENCOUNTER — Other Ambulatory Visit (HOSPITAL_COMMUNITY): Payer: Self-pay

## 2024-02-10 ENCOUNTER — Other Ambulatory Visit (HOSPITAL_COMMUNITY): Payer: Self-pay

## 2024-03-30 DIAGNOSIS — H43813 Vitreous degeneration, bilateral: Secondary | ICD-10-CM | POA: Diagnosis not present

## 2024-03-30 DIAGNOSIS — E119 Type 2 diabetes mellitus without complications: Secondary | ICD-10-CM | POA: Diagnosis not present

## 2024-03-30 DIAGNOSIS — M3501 Sicca syndrome with keratoconjunctivitis: Secondary | ICD-10-CM | POA: Diagnosis not present

## 2024-03-30 DIAGNOSIS — H2589 Other age-related cataract: Secondary | ICD-10-CM | POA: Diagnosis not present

## 2024-04-04 DIAGNOSIS — H2589 Other age-related cataract: Secondary | ICD-10-CM | POA: Diagnosis not present

## 2024-04-06 ENCOUNTER — Encounter: Payer: Self-pay | Admitting: Ophthalmology

## 2024-04-06 NOTE — Anesthesia Preprocedure Evaluation (Addendum)
 Anesthesia Evaluation  Patient identified by MRN, date of birth, ID band Patient awake    Reviewed: Allergy & Precautions, H&P , NPO status , Patient's Chart, lab work & pertinent test results  Airway Mallampati: I  TM Distance: >3 FB Neck ROM: Full    Dental no notable dental hx. (+) Missing   Pulmonary neg pulmonary ROS   Pulmonary exam normal breath sounds clear to auscultation       Cardiovascular negative cardio ROS Normal cardiovascular exam Rhythm:Regular Rate:Normal     Neuro/Psych negative neurological ROS  negative psych ROS   GI/Hepatic negative GI ROS, Neg liver ROS,,,  Endo/Other  diabetes    Renal/GU negative Renal ROS  negative genitourinary   Musculoskeletal negative musculoskeletal ROS (+)    Abdominal   Peds negative pediatric ROS (+)  Hematology negative hematology ROS (+)   Anesthesia Other Findings Last GLP-1 04-06-24  Hot flashes Very anxious, requests preop versed  IV, will administer  Reproductive/Obstetrics negative OB ROS                              Anesthesia Physical Anesthesia Plan  ASA: 2  Anesthesia Plan: MAC   Post-op Pain Management:    Induction: Intravenous  PONV Risk Score and Plan:   Airway Management Planned: Natural Airway and Nasal Cannula  Additional Equipment:   Intra-op Plan:   Post-operative Plan:   Informed Consent: I have reviewed the patients History and Physical, chart, labs and discussed the procedure including the risks, benefits and alternatives for the proposed anesthesia with the patient or authorized representative who has indicated his/her understanding and acceptance.     Dental Advisory Given  Plan Discussed with: Anesthesiologist, CRNA and Surgeon  Anesthesia Plan Comments: (Patient consented for risks of anesthesia including but not limited to:  - adverse reactions to medications - damage to eyes, teeth,  lips or other oral mucosa - nerve damage due to positioning  - sore throat or hoarseness - Damage to heart, brain, nerves, lungs, other parts of body or loss of life  Patient voiced understanding and assent.)         Anesthesia Quick Evaluation

## 2024-04-11 NOTE — Discharge Instructions (Signed)

## 2024-04-12 ENCOUNTER — Ambulatory Visit: Payer: Self-pay | Admitting: Anesthesiology

## 2024-04-12 ENCOUNTER — Other Ambulatory Visit: Payer: Self-pay

## 2024-04-12 ENCOUNTER — Ambulatory Visit
Admission: RE | Admit: 2024-04-12 | Discharge: 2024-04-12 | Disposition: A | Discharge status: Home / Self Care | Attending: Ophthalmology | Admitting: Ophthalmology

## 2024-04-12 ENCOUNTER — Encounter: Payer: Self-pay | Admitting: Ophthalmology

## 2024-04-12 ENCOUNTER — Encounter
Admission: RE | Disposition: A | Discharge status: Home / Self Care | Payer: Self-pay | Source: Home / Self Care | Attending: Ophthalmology

## 2024-04-12 DIAGNOSIS — H2512 Age-related nuclear cataract, left eye: Secondary | ICD-10-CM | POA: Diagnosis not present

## 2024-04-12 DIAGNOSIS — H2589 Other age-related cataract: Secondary | ICD-10-CM | POA: Diagnosis not present

## 2024-04-12 DIAGNOSIS — Z7985 Long-term (current) use of injectable non-insulin antidiabetic drugs: Secondary | ICD-10-CM | POA: Diagnosis not present

## 2024-04-12 DIAGNOSIS — E1136 Type 2 diabetes mellitus with diabetic cataract: Secondary | ICD-10-CM | POA: Diagnosis not present

## 2024-04-12 HISTORY — PX: CATARACT EXTRACTION W/PHACO: SHX586

## 2024-04-12 LAB — GLUCOSE, CAPILLARY: Glucose-Capillary: 89 mg/dL (ref 70–99)

## 2024-04-12 SURGERY — PHACOEMULSIFICATION, CATARACT, WITH IOL INSERTION
Anesthesia: Monitor Anesthesia Care | Site: Eye | Laterality: Left

## 2024-04-12 MED ORDER — BRIMONIDINE TARTRATE-TIMOLOL 0.2-0.5 % OP SOLN
OPHTHALMIC | Status: DC | PRN
  Administered 2024-04-12: 1 [drp] via OPHTHALMIC

## 2024-04-12 MED ORDER — SIGHTPATH DOSE#1 BSS IO SOLN
INTRAOCULAR | Status: DC | PRN
  Administered 2024-04-12: 57 mL via OPHTHALMIC

## 2024-04-12 MED ORDER — FENTANYL CITRATE (PF) 100 MCG/2ML IJ SOLN
INTRAMUSCULAR | Status: DC | PRN
  Administered 2024-04-12: 50 ug via INTRAVENOUS

## 2024-04-12 MED ORDER — MIDAZOLAM HCL (PF) 2 MG/2ML IJ SOLN
INTRAMUSCULAR | Status: DC | PRN
  Administered 2024-04-12: 1 mg via INTRAVENOUS

## 2024-04-12 MED ORDER — TRYPAN BLUE 0.06 % IO SOSY
PREFILLED_SYRINGE | INTRAOCULAR | Status: DC | PRN
  Administered 2024-04-12: .5 mL via INTRAOCULAR

## 2024-04-12 MED ORDER — SIGHTPATH DOSE#1 NA CHONDROIT SULF-NA HYALURON 40-17 MG/ML IO SOLN
INTRAOCULAR | Status: DC | PRN
  Administered 2024-04-12: 1 mL via INTRAOCULAR

## 2024-04-12 MED ORDER — LIDOCAINE HCL (PF) 2 % IJ SOLN
INTRAOCULAR | Status: DC | PRN
  Administered 2024-04-12: 2 mL

## 2024-04-12 MED ORDER — MIDAZOLAM HCL 2 MG/2ML IJ SOLN
INTRAMUSCULAR | Status: AC
  Filled 2024-04-12: qty 2

## 2024-04-12 MED ORDER — LACTATED RINGERS IV SOLN: INTRAVENOUS | Status: DC

## 2024-04-12 MED ORDER — SIGHTPATH DOSE#1 BSS IO SOLN
INTRAOCULAR | Status: DC | PRN
  Administered 2024-04-12: 15 mL via INTRAOCULAR

## 2024-04-12 MED ORDER — PHENYLEPHRINE HCL 10 % OP SOLN
1.0000 [drp] | OPHTHALMIC | Status: AC
  Administered 2024-04-12 (×3): 1 [drp] via OPHTHALMIC

## 2024-04-12 MED ORDER — MOXIFLOXACIN HCL 0.5 % OP SOLN
OPHTHALMIC | Status: DC | PRN
  Administered 2024-04-12: .2 mL via OPHTHALMIC

## 2024-04-12 MED ORDER — MIDAZOLAM HCL (PF) 2 MG/2ML IJ SOLN
2.0000 mg | INTRAMUSCULAR | Status: DC | PRN
  Administered 2024-04-12: 2 mg via INTRAVENOUS

## 2024-04-12 MED ORDER — TETRACAINE HCL 0.5 % OP SOLN
1.0000 [drp] | OPHTHALMIC | Status: AC
  Administered 2024-04-12 (×3): 1 [drp] via OPHTHALMIC

## 2024-04-12 MED ORDER — FENTANYL CITRATE (PF) 100 MCG/2ML IJ SOLN
INTRAMUSCULAR | Status: AC
  Filled 2024-04-12: qty 2

## 2024-04-12 MED ORDER — CYCLOPENTOLATE HCL 2 % OP SOLN
1.0000 [drp] | OPHTHALMIC | Status: AC
  Administered 2024-04-12 (×3): 1 [drp] via OPHTHALMIC

## 2024-04-12 SURGICAL SUPPLY — 9 items
CANNULA ANT/CHMB 27G (MISCELLANEOUS) ×2 IMPLANT
CYSTOTOME ANGL RVRS SHRT 25G (CUTTER) ×2 IMPLANT
FEE CATARACT SUITE SIGHTPATH (MISCELLANEOUS) ×2 IMPLANT
GLOVE BIOGEL PI IND STRL 8 (GLOVE) ×2 IMPLANT
GLOVE SURG LX STRL 8.0 MICRO (GLOVE) ×2 IMPLANT
GLOVE SURG SYN 6.5 PF PI BL (GLOVE) ×2 IMPLANT
LENS IOL CLRN CLEAR 21.0 (Intraocular Lens) IMPLANT
NDL FILTER BLUNT 18X1 1/2 (NEEDLE) ×2 IMPLANT
SYR 3ML LL SCALE MARK (SYRINGE) ×2 IMPLANT

## 2024-04-12 NOTE — Op Note (Signed)
 PREOPERATIVE DIAGNOSIS:  Nuclear sclerotic cataract of the left eye.   POSTOPERATIVE DIAGNOSIS:  Nuclear sclerotic cataract of the left eye.   OPERATIVE PROCEDURE:ORPROCALL@   SURGEON:  Carly Carmine, MD.   ANESTHESIA:  Anesthesiologist: Ola Donny BROCKS, MD CRNA: Jahoo, Sonia, CRNA  1.      Managed anesthesia care. 2.     0.8ml of Shugarcaine was instilled following the paracentesis   COMPLICATIONS: Vision Blue was used to stain the anterior capsule due to very poor/ no visualization of the red reflex.    TECHNIQUE:   Stop and chop   DESCRIPTION OF PROCEDURE:  The patient was examined and consented in the preoperative holding area where the aforementioned topical anesthesia was applied to the left eye and then brought back to the Operating Room where the left eye was prepped and draped in the usual sterile ophthalmic fashion and a lid speculum was placed. A paracentesis was created with the side port blade and the anterior chamber was filled with viscoelastic. A near clear corneal incision was performed with the steel keratome. A continuous curvilinear capsulorrhexis was performed with a cystotome followed by the capsulorrhexis forceps. Hydrodissection and hydrodelineation were carried out with BSS on a blunt cannula. The lens was removed in a stop and chop  technique and the remaining cortical material was removed with the irrigation-aspiration handpiece. The capsular bag was inflated with viscoelastic and the intraocular lens was placed in the capsular bag without complication. The remaining viscoelastic was removed from the eye with the irrigation-aspiration handpiece. The wounds were hydrated. The anterior chamber was flushed with BSS and the eye was inflated to physiologic pressure. 0.67ml Vigamox was placed in the anterior chamber. The wounds were found to be water  tight. The eye was dressed with Combigan. The patient was given protective glasses to wear throughout the day and a shield  with which to sleep tonight. The patient was also given drops with which to begin a drop regimen today and will follow-up with me in one day. Implant Name Type Inv. Item Serial No. Manufacturer Lot No. LRB No. Used Action  LENS IOL CLRN CLEAR 21.0 - [id number] Intraocular Lens LENS IOL CLRN CLEAR 21.0 [id number] [employer]  Left 1 Implanted    Procedure(s): PHACOEMULSIFICATION, CATARACT, WITH IOL INSERTION 9.69 00:54.1 (Left)  Electronically signed: Elsie Santiago 04/12/2024 8:15 AM

## 2024-04-12 NOTE — H&P (Signed)
 [facility]   Primary Care Physician:  Tommas Pears, MD Ophthalmologist: Dr. Elsie Carmine  Pre-Procedure History & Physical: HPI:  Carly Santiago is a 58 y.o. female here for cataract surgery.   Past Medical History:  Diagnosis Date   Diabetes mellitus without complication (HCC)    H/O gastric sleeve 09/21/2017   laparascopic gastric sleleve    Past Surgical History:  Procedure Laterality Date   BREAST SURGERY     breast reduction   coposcopy     for cervical cancer   INTRAUTERINE DEVICE (IUD) INSERTION     IUD REMOVAL  2017   LAPAROSCOPIC GASTRIC SLEEVE RESECTION N/A 09/21/2017   Procedure: LAPAROSCOPIC GASTRIC SLEEVE RESECTION, Upper Endo;  Surgeon: Mikell Katz, MD;  Location: [facility];  Service: General;  Laterality: N/A;    Prior to Admission medications   Medication Sig Start Date End Date Taking? Authorizing Provider  BIOTIN PO Take 1 tablet by mouth daily.    Yes [provider]  Multiple Vitamin (MULTIVITAMIN) capsule Take 1 capsule by mouth daily.   Yes [provider]  Semaglutide , 2 MG/DOSE, (OZEMPIC , 2 MG/DOSE,) 8 MG/3ML SOPN Inject 2 mg into the skin once a week. 12/23/21  Yes   tretinoin (RETIN-A) 0.1 % cream SMARTSIG:Sparingly Topical Every Night 04/22/22  Yes [provider]  Vitamin D , Ergocalciferol , (DRISDOL ) 1.25 MG (50000 UNIT) CAPS capsule Take 1 capsule (50,000 Units total) by mouth once a week. 06/16/23  Yes   Blood Glucose Monitoring Suppl (FREESTYLE LITE) w/Device KIT Use as directed once daily 09/17/22     glucose blood (FREESTYLE LITE) test strip Use to test blood sugar once daily. 07/10/23   Tommas Pears, MD  glucose blood test strip Use as directed once daily 10/01/21     glucose blood test strip Use as directed once daily 09/17/22   Tommas Pears, MD  Lancets (FREESTYLE) lancets Use as directed to monitor glucose once daily 07/30/23   Tommas Pears, MD  [facility] DELICA LANCETS 33G MISC  12/13/16    [provider]  [facility] VERIO test strip  02/28/17   [provider]    Allergies as of 04/01/2024   (No Known Allergies)    History reviewed. No pertinent family history.  Social History   Socioeconomic History   Marital status: Married    Spouse name: Not on file   Number of children: Not on file   Years of education: Not on file   Highest education level: Not on file  Occupational History   Not on file  Tobacco Use   Smoking status: Never   Smokeless tobacco: Never  Vaping Use   Vaping status: Never Used  Substance and Sexual Activity   Alcohol use: Yes    Comment: socially   Drug use: No   Sexual activity: Yes  Other Topics Concern   Not on file  Social History Narrative   Not on file   Social Drivers of Health   Financial Resource Strain: Not on file  Food Insecurity: Not on file  Transportation Needs: Not on file  Physical Activity: Not on file  Stress: Not on file  Social Connections: Not on file  Intimate Partner Violence: Not on file    Review of Systems: See HPI, otherwise negative ROS  Physical Exam: BP 119/83   Pulse 60   Temp 98 F (36.7 C) (Temporal)   Resp 16   Ht 5' 9 (1.753 m)   Wt 92.1 kg  SpO2 98%   BMI 29.98 kg/m  General:   Alert, cooperative. Head:  Normocephalic and atraumatic. Respiratory:  Normal work of breathing. Cardiovascular:  NAD  Impression/Plan: Carly Santiago is here for cataract surgery.  Risks, benefits, limitations, and alternatives regarding cataract surgery have been reviewed with the patient.  Questions have been answered.  All parties agreeable.   Elsie Carmine, MD  04/12/2024, 7:48 AM

## 2024-04-12 NOTE — Anesthesia Postprocedure Evaluation (Signed)
 Anesthesia Post Note  Patient: Carly Santiago  Procedure(s) Performed: PHACOEMULSIFICATION, CATARACT, WITH IOL INSERTION 9.69 00:54.1 (Left: Eye)  Patient location during evaluation: PACU Anesthesia Type: MAC Level of consciousness: awake and alert Pain management: pain level controlled Vital Signs Assessment: post-procedure vital signs reviewed and stable Respiratory status: spontaneous breathing, nonlabored ventilation, respiratory function stable and patient connected to nasal cannula oxygen Cardiovascular status: stable and blood pressure returned to baseline Postop Assessment: no apparent nausea or vomiting Anesthetic complications: no   No notable events documented.   Last Vitals:  Vitals:   04/12/24 0816 04/12/24 0820  BP: 127/82 111/79  Pulse: (!) 52 (!) 52  Resp:  14  Temp:    SpO2: 99% 99%    Last Pain:  Vitals:   04/12/24 0820  TempSrc:   PainSc: 0-No pain                 Willford Rabideau C [name]

## 2024-04-12 NOTE — Transfer of Care (Signed)
 Immediate Anesthesia Transfer of Care Note  Patient: Carly Santiago  Procedure(s) Performed: PHACOEMULSIFICATION, CATARACT, WITH IOL INSERTION 9.69 00:54.1 (Left: Eye)  Patient Location: PACU  Anesthesia Type: MAC  Level of Consciousness: awake, alert  and patient cooperative  Airway and Oxygen Therapy: Patient Spontanous Breathing and Patient connected to supplemental oxygen  Post-op Assessment: Post-op Vital signs reviewed, Patient's Cardiovascular Status Stable, Respiratory Function Stable, Patent Airway and No signs of Nausea or vomiting  Post-op Vital Signs: Reviewed and stable  Complications: No notable events documented.

## 2024-04-17 DIAGNOSIS — Z111 Encounter for screening for respiratory tuberculosis: Secondary | ICD-10-CM | POA: Diagnosis not present

## 2024-04-19 DIAGNOSIS — Z111 Encounter for screening for respiratory tuberculosis: Secondary | ICD-10-CM | POA: Diagnosis not present

## 2024-04-20 ENCOUNTER — Encounter (HOSPITAL_COMMUNITY): Payer: Self-pay | Admitting: *Deleted

## 2024-06-03 ENCOUNTER — Other Ambulatory Visit (HOSPITAL_COMMUNITY): Payer: Self-pay

## 2024-06-04 ENCOUNTER — Encounter (HOSPITAL_COMMUNITY): Payer: Self-pay

## 2024-06-04 ENCOUNTER — Other Ambulatory Visit (HOSPITAL_COMMUNITY): Payer: Self-pay
